# Patient Record
Sex: Female | Born: 1950 | Race: White | Hispanic: No | Marital: Married | Smoking: Former smoker
Health system: Southern US, Community
[De-identification: ages and names within clinical notes are randomized; demographics above are authoritative.]

## PROBLEM LIST (undated history)

## (undated) DIAGNOSIS — C801 Malignant (primary) neoplasm, unspecified: Secondary | ICD-10-CM

## (undated) DIAGNOSIS — K219 Gastro-esophageal reflux disease without esophagitis: Secondary | ICD-10-CM

## (undated) DIAGNOSIS — Z86010 Personal history of colonic polyps: Secondary | ICD-10-CM

## (undated) DIAGNOSIS — F32A Depression, unspecified: Secondary | ICD-10-CM

## (undated) DIAGNOSIS — F419 Anxiety disorder, unspecified: Secondary | ICD-10-CM

## (undated) DIAGNOSIS — E785 Hyperlipidemia, unspecified: Secondary | ICD-10-CM

## (undated) DIAGNOSIS — F329 Major depressive disorder, single episode, unspecified: Secondary | ICD-10-CM

## (undated) DIAGNOSIS — E079 Disorder of thyroid, unspecified: Secondary | ICD-10-CM

## (undated) DIAGNOSIS — G4730 Sleep apnea, unspecified: Secondary | ICD-10-CM

## (undated) DIAGNOSIS — T7840XA Allergy, unspecified, initial encounter: Secondary | ICD-10-CM

## (undated) DIAGNOSIS — I10 Essential (primary) hypertension: Secondary | ICD-10-CM

## (undated) HISTORY — DX: Hyperlipidemia, unspecified: E78.5

## (undated) HISTORY — DX: Major depressive disorder, single episode, unspecified: F32.9

## (undated) HISTORY — DX: Depression, unspecified: F32.A

## (undated) HISTORY — DX: Disorder of thyroid, unspecified: E07.9

## (undated) HISTORY — DX: Malignant (primary) neoplasm, unspecified: C80.1

## (undated) HISTORY — DX: Personal history of colonic polyps: Z86.010

## (undated) HISTORY — DX: Anxiety disorder, unspecified: F41.9

## (undated) HISTORY — PX: COLONOSCOPY: SHX174

## (undated) HISTORY — DX: Allergy, unspecified, initial encounter: T78.40XA

## (undated) HISTORY — PX: TONSILLECTOMY: SUR1361

## (undated) HISTORY — DX: Sleep apnea, unspecified: G47.30

## (undated) HISTORY — DX: Essential (primary) hypertension: I10

## (undated) HISTORY — PX: POLYPECTOMY: SHX149

## (undated) HISTORY — DX: Gastro-esophageal reflux disease without esophagitis: K21.9

## (undated) HISTORY — PX: BLADDER SUSPENSION: SHX72

---

## 1991-01-20 HISTORY — PX: BREAST LUMPECTOMY: SHX2

## 1991-01-20 HISTORY — PX: BREAST EXCISIONAL BIOPSY: SUR124

## 1991-01-20 HISTORY — PX: VAGINAL HYSTERECTOMY: SUR661

## 1997-06-22 ENCOUNTER — Ambulatory Visit (HOSPITAL_COMMUNITY): Admission: RE | Admit: 1997-06-22 | Discharge: 1997-06-22 | Payer: Self-pay | Admitting: Surgery

## 1998-09-27 ENCOUNTER — Ambulatory Visit (HOSPITAL_COMMUNITY): Admission: RE | Admit: 1998-09-27 | Discharge: 1998-09-27 | Payer: Self-pay | Admitting: Surgery

## 1998-09-27 ENCOUNTER — Encounter: Payer: Self-pay | Admitting: Surgery

## 1999-05-23 ENCOUNTER — Other Ambulatory Visit: Admission: RE | Admit: 1999-05-23 | Discharge: 1999-05-23 | Payer: Self-pay | Admitting: Obstetrics & Gynecology

## 2000-05-20 ENCOUNTER — Other Ambulatory Visit: Admission: RE | Admit: 2000-05-20 | Discharge: 2000-05-20 | Payer: Self-pay | Admitting: Obstetrics & Gynecology

## 2001-06-27 ENCOUNTER — Other Ambulatory Visit: Admission: RE | Admit: 2001-06-27 | Discharge: 2001-06-27 | Payer: Self-pay | Admitting: Obstetrics & Gynecology

## 2002-08-02 ENCOUNTER — Other Ambulatory Visit: Admission: RE | Admit: 2002-08-02 | Discharge: 2002-08-02 | Payer: Self-pay | Admitting: Obstetrics & Gynecology

## 2003-08-20 ENCOUNTER — Encounter: Admission: RE | Admit: 2003-08-20 | Discharge: 2003-08-20 | Payer: Self-pay | Admitting: Obstetrics & Gynecology

## 2003-08-20 ENCOUNTER — Other Ambulatory Visit: Admission: RE | Admit: 2003-08-20 | Discharge: 2003-08-20 | Payer: Self-pay | Admitting: Obstetrics & Gynecology

## 2004-02-22 ENCOUNTER — Encounter: Admission: RE | Admit: 2004-02-22 | Discharge: 2004-02-22 | Payer: Self-pay | Admitting: Obstetrics & Gynecology

## 2004-10-10 ENCOUNTER — Encounter: Admission: RE | Admit: 2004-10-10 | Discharge: 2004-10-10 | Payer: Self-pay | Admitting: Obstetrics & Gynecology

## 2004-10-13 ENCOUNTER — Other Ambulatory Visit: Admission: RE | Admit: 2004-10-13 | Discharge: 2004-10-13 | Payer: Self-pay | Admitting: Obstetrics & Gynecology

## 2005-10-19 ENCOUNTER — Encounter: Admission: RE | Admit: 2005-10-19 | Discharge: 2005-10-19 | Payer: Self-pay | Admitting: Obstetrics & Gynecology

## 2005-10-23 ENCOUNTER — Encounter: Admission: RE | Admit: 2005-10-23 | Discharge: 2005-10-23 | Payer: Self-pay | Admitting: Obstetrics & Gynecology

## 2006-04-07 ENCOUNTER — Encounter: Admission: RE | Admit: 2006-04-07 | Discharge: 2006-04-07 | Payer: Self-pay | Admitting: Obstetrics & Gynecology

## 2006-11-04 ENCOUNTER — Encounter: Admission: RE | Admit: 2006-11-04 | Discharge: 2006-11-04 | Payer: Self-pay | Admitting: Obstetrics & Gynecology

## 2006-11-10 ENCOUNTER — Encounter: Admission: RE | Admit: 2006-11-10 | Discharge: 2006-11-10 | Payer: Self-pay | Admitting: Obstetrics & Gynecology

## 2007-12-02 ENCOUNTER — Encounter: Admission: RE | Admit: 2007-12-02 | Discharge: 2007-12-02 | Payer: Self-pay | Admitting: Obstetrics & Gynecology

## 2008-12-18 ENCOUNTER — Encounter: Admission: RE | Admit: 2008-12-18 | Discharge: 2008-12-18 | Payer: Self-pay | Admitting: Obstetrics & Gynecology

## 2009-07-16 ENCOUNTER — Ambulatory Visit (HOSPITAL_BASED_OUTPATIENT_CLINIC_OR_DEPARTMENT_OTHER): Admission: RE | Admit: 2009-07-16 | Discharge: 2009-07-16 | Payer: Self-pay | Admitting: Urology

## 2010-02-08 ENCOUNTER — Encounter: Payer: Self-pay | Admitting: Obstetrics & Gynecology

## 2010-02-09 ENCOUNTER — Encounter: Payer: Self-pay | Admitting: Obstetrics & Gynecology

## 2010-02-11 ENCOUNTER — Encounter
Admission: RE | Admit: 2010-02-11 | Discharge: 2010-02-11 | Payer: Self-pay | Source: Home / Self Care | Attending: Obstetrics & Gynecology | Admitting: Obstetrics & Gynecology

## 2010-02-11 IMAGING — MG MM DIGITAL SCREENING
4 series · 4 of 4 positions shown · non-contrast
Comparison: none

DG SCREEN MAMMOGRAM BILATERAL
Bilateral CC and MLO view(s) were taken.
Technologist: CHOUAA

DIGITAL SCREENING MAMMOGRAM WITH CAD:
There are scattered fibroglandular densities.  No masses or malignant type calcifications are 
identified.  Compared with prior studies.
Images were processed with CAD.

[R CC]
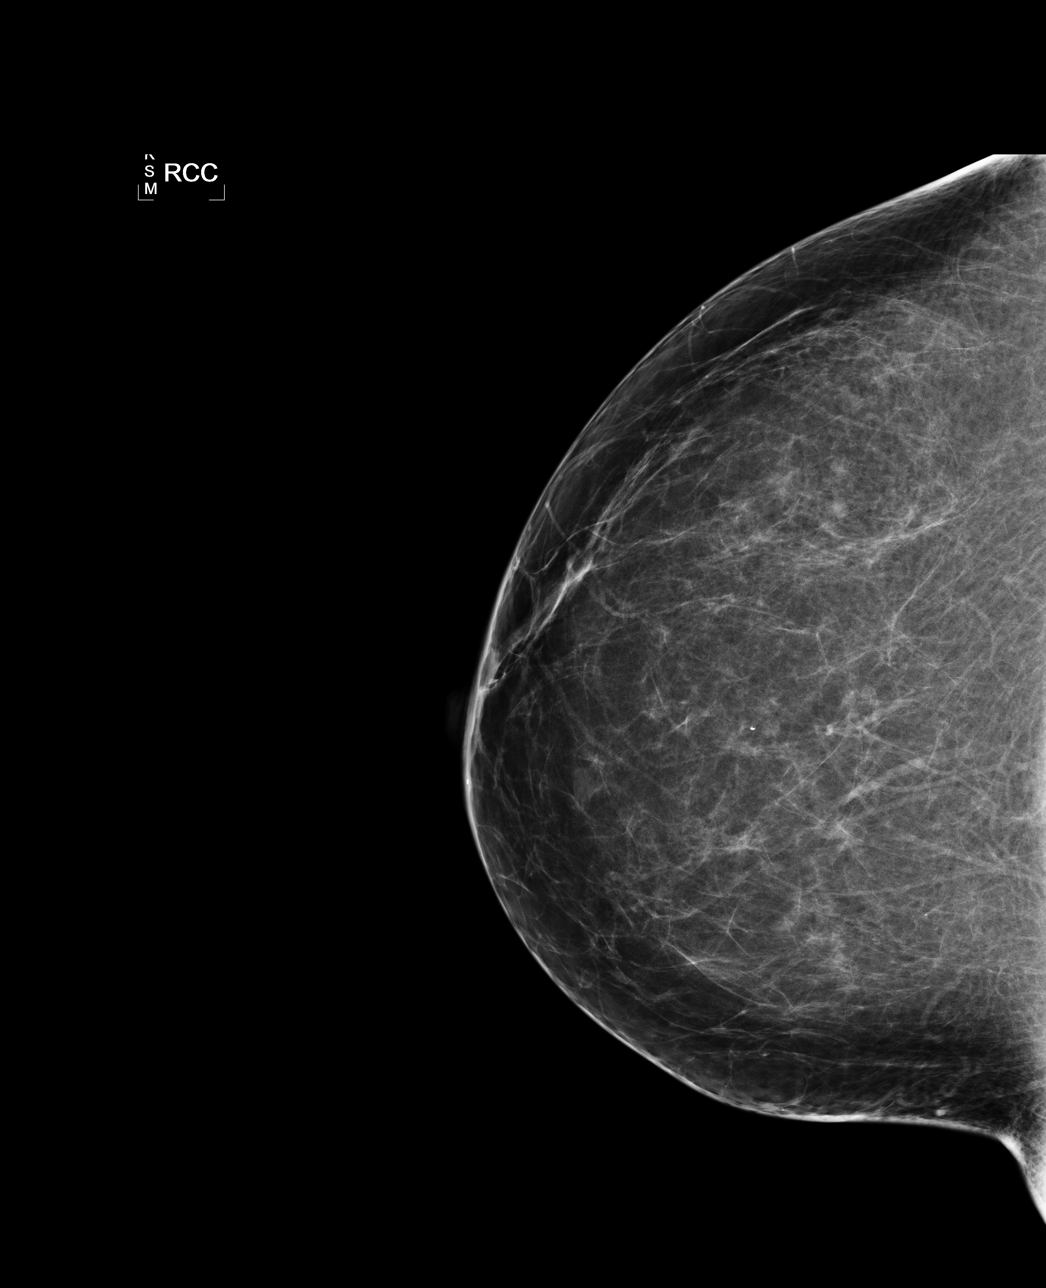

[L CC]
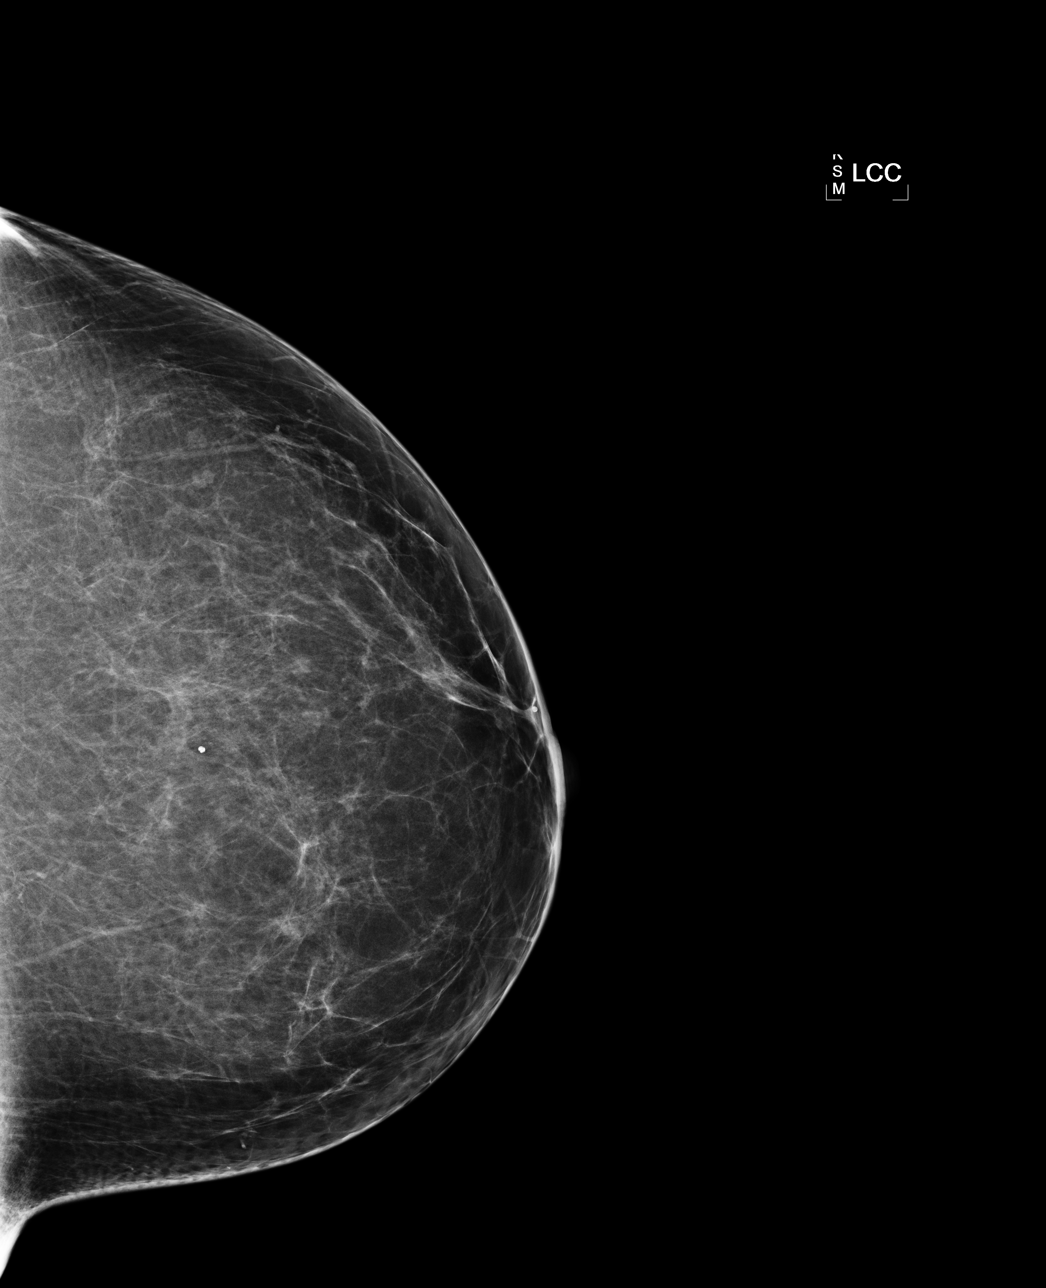

[L MLO]
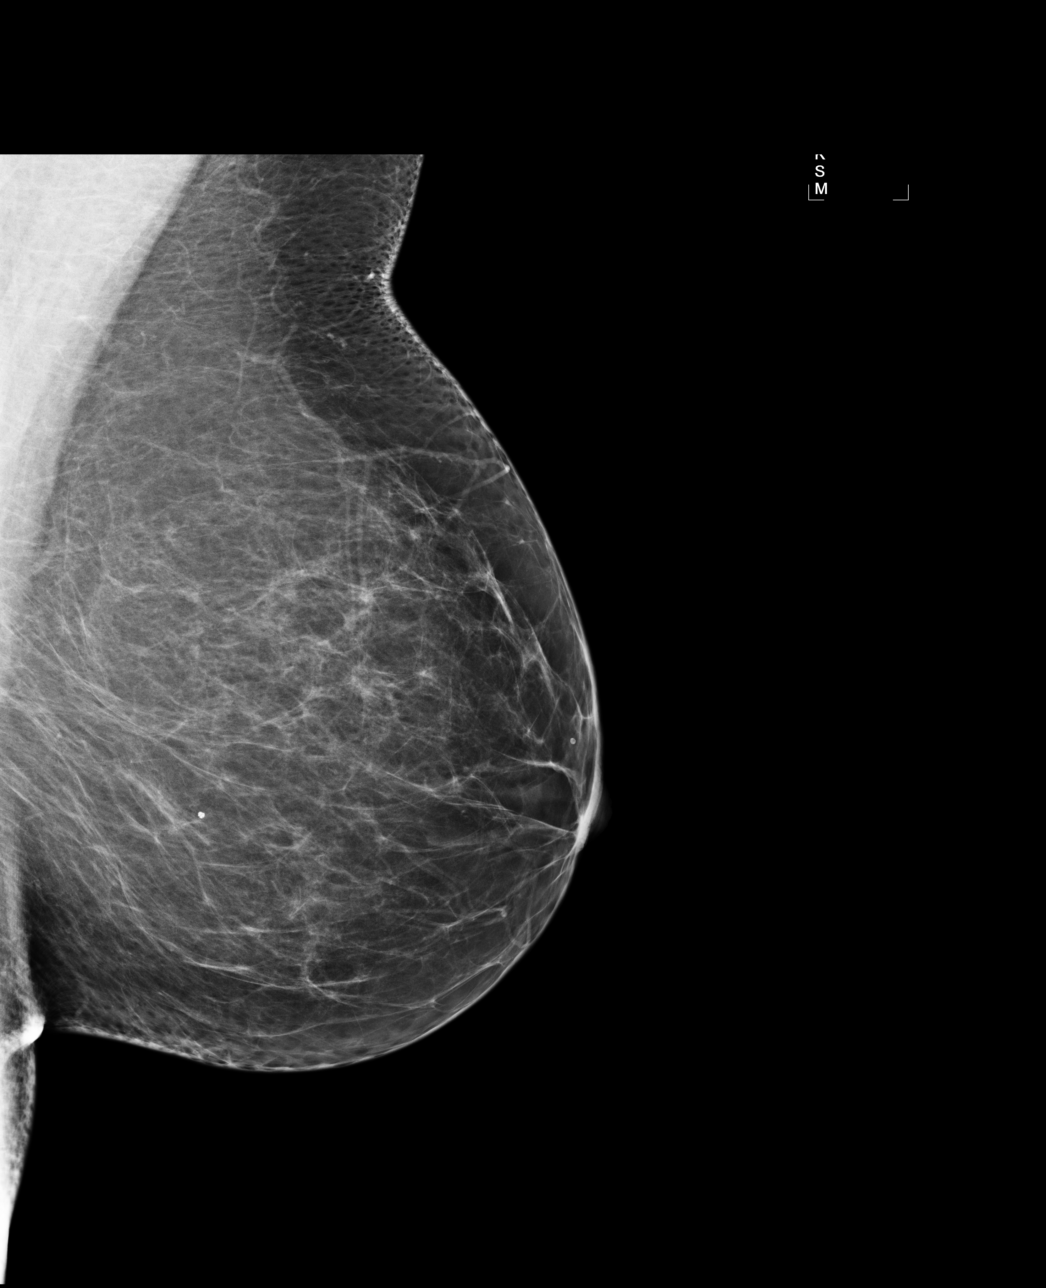

[R MLO]
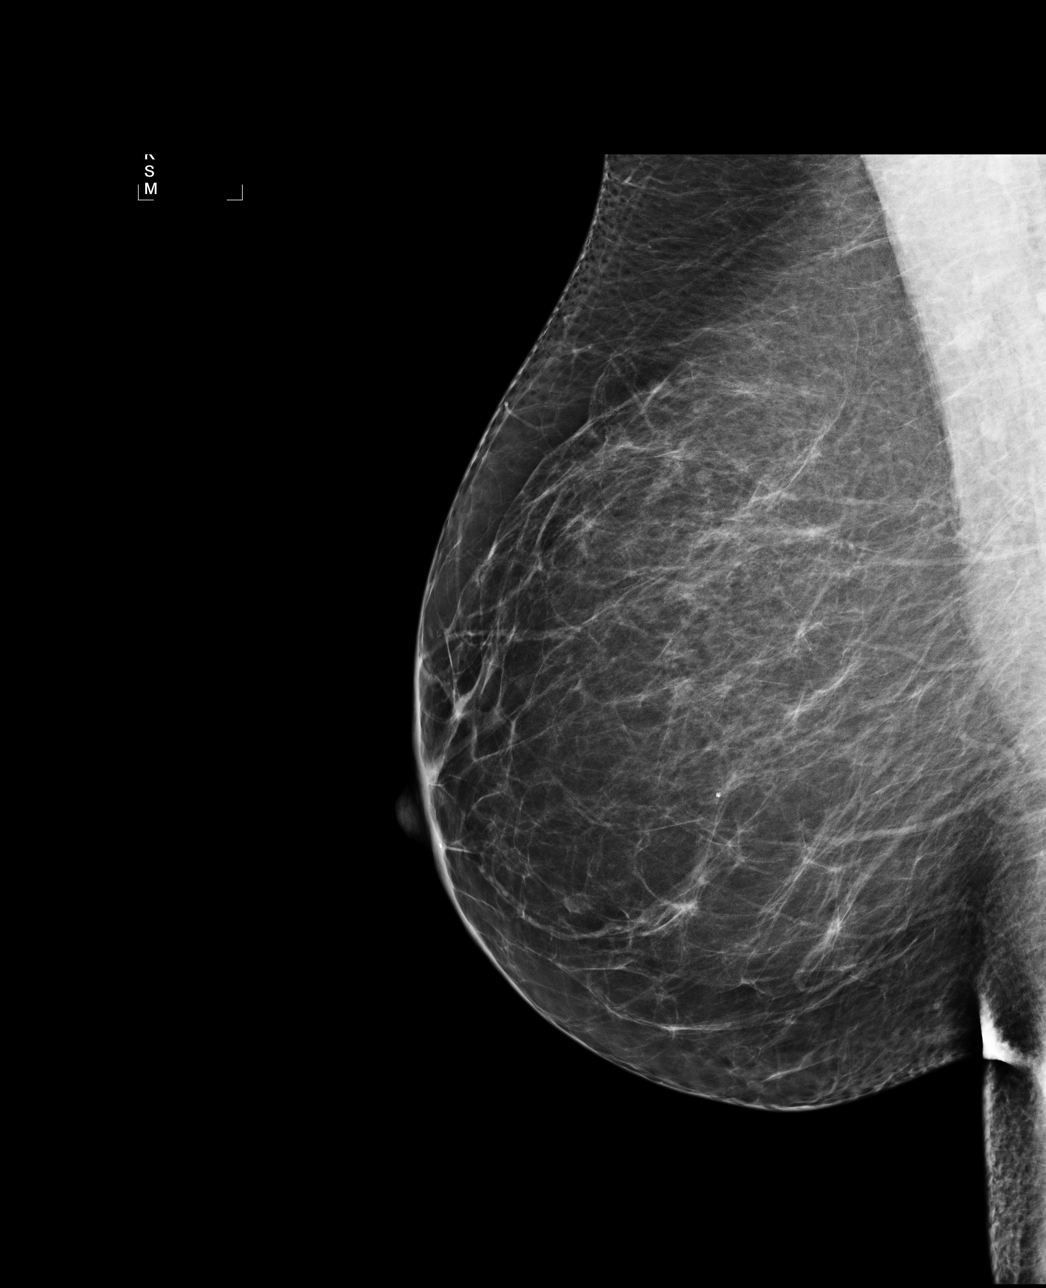

[4 of 4 positions shown; findings below may reference images not displayed]

IMPRESSION: No specific mammographic evidence of malignancy.  Next screening mammogram is recommended in one 
year.

A result letter of this screening mammogram will be mailed directly to the patient.

ASSESSMENT: Negative - BI-RADS 1

Screening mammogram in 1 year.
,

## 2010-02-12 ENCOUNTER — Encounter: Payer: Self-pay | Admitting: Internal Medicine

## 2010-02-20 NOTE — Letter (Signed)
Summary: Pre Visit Letter Revised  [facility]  [address] [place], [place] [address]   Phone: [phone]  Fax: [phone]        02/12/2010 MRN: [id number]  [facility] [address] [place], [place]  [address]             Procedure Date: 03-20-10  8am           Direct Colon -Dr Abundio Miu to the Gastroenterology Division at [facility].    You are scheduled to see a nurse for your pre-procedure visit on 03-06-10 at 3:30pm on the 3rd floor at [facility], [address]  We ask that you try to arrive at our office 15 minutes prior to your appointment time to allow for check-in.  Please take a minute to review the attached form.  If you answer "Yes" to one or more of the questions on the first page, we ask that you call the person listed at your earliest opportunity.  If you answer "No" to all of the questions, please complete the rest of the form and bring it to your appointment.    Your nurse visit will consist of discussing your medical and surgical history, your immediate family medical history, and your medications.   If you are unable to list all of your medications on the form, please bring the medication bottles to your appointment and we will list them.  We will need to be aware of both prescribed and over the counter drugs.  We will need to know exact dosage information as well.    Please be prepared to read and sign documents such as consent forms, a financial agreement, and acknowledgement forms.  If necessary, and with your consent, a friend or relative is welcome to sit-in on the nurse visit with you.  Please bring your insurance card so that we may make a copy of it.  If your insurance requires a referral to see a specialist, please bring your referral form from your primary care physician.  No co-pay is required for this nurse visit.     If you cannot keep your appointment, please call [phone] to cancel or reschedule  prior to your appointment date.  This allows [place] the opportunity to schedule an appointment for another patient in need of care.    Thank you for choosing [facility] for your medical needs.  We appreciate the opportunity to care for you.  Please visit [place] at our website  to learn more about our practice.  Sincerely, The Gastroenterology Division

## 2010-03-04 ENCOUNTER — Encounter (INDEPENDENT_AMBULATORY_CARE_PROVIDER_SITE_OTHER): Payer: Self-pay | Admitting: *Deleted

## 2010-03-06 ENCOUNTER — Encounter: Payer: Self-pay | Admitting: Internal Medicine

## 2010-03-12 NOTE — Miscellaneous (Signed)
Summary: [facility]  Clinical Lists Changes  Medications: Added new medication of MOVIPREP 100 GM  SOLR (PEG-KCL-NACL-NASULF-NA ASC-C) As per prep instructions. - Signed Rx of MOVIPREP 100 GM  SOLR (PEG-KCL-NACL-NASULF-NA ASC-C) As per prep instructions.;  #1 x 0;  Signed;  Entered by: Ezra Sites RN;  Authorized by: Iva Boop MD, FACG;  Method used: Electronically to [facility]  [address] [id number] [address] [place], [place]  [address], Ph: [phone] or [phone], Fax: [phone] Allergies: Added new allergy or adverse reaction of PCN Observations: Added new observation of NKA: F (03/06/2010 15:06)    Prescriptions: MOVIPREP 100 GM  SOLR (PEG-KCL-NACL-NASULF-NA ASC-C) As per prep instructions.  #1 x 0   Entered by:   Ezra Sites RN   Authorized by:   Iva Boop MD, [facility]   Signed by:   Ezra Sites RN on 03/06/2010   Method used:   [facility]ronically to        [facility]  [address] [id number] ([facility]ail)       [address]       [place], [place]  [address]       Ph: [phone] or [phone]       Fax: [phone]   RxID:   [id number]

## 2010-03-12 NOTE — Letter (Signed)
Summary: [facility] Instructions  [facility]  [address] [place], [place] [address]   Phone: [phone]  Fax: [phone]       Susan Thornton    08/07/1950    MRN: [id number]        Procedure Day /Date:  Monday 04/14/2010     Arrival Time:  12:30 pm     Procedure Time:  1:30 pm     Location of Procedure:                    _X _  [facility] (4th Floor)                        PREPARATION FOR COLONOSCOPY WITH MOVIPREP   Starting 5 days prior to your procedure  Wednesday 3/21 do not eat nuts, seeds, popcorn, corn, beans, peas,  salads, or any raw vegetables.  Do not take any fiber supplements (e.g. Metamucil, Citrucel, and Benefiber).  THE DAY BEFORE YOUR PROCEDURE         DATE:  Sunday 3/25  1.  Drink clear liquids the entire day-NO SOLID FOOD  2.  Do not drink anything colored red or purple.  Avoid juices with pulp.  No orange juice.  3.  Drink at least 64 oz. (8 glasses) of fluid/clear liquids during the day to prevent dehydration and help the prep work efficiently.  CLEAR LIQUIDS INCLUDE: Water Jello Ice Popsicles Tea (sugar ok, no milk/cream) Powdered fruit flavored drinks Coffee (sugar ok, no milk/cream) Gatorade Juice: apple, white grape, white cranberry  Lemonade Clear bullion, consomm, broth Carbonated beverages (any kind) Strained chicken noodle soup Hard Candy                             4.  In the morning, mix first dose of MoviPrep solution:    Empty 1 Pouch A and 1 Pouch B into the disposable container    Add lukewarm drinking water to the top line of the container. Mix to dissolve    Refrigerate (mixed solution should be used within 24 hrs)  5.  Begin drinking the prep at 5:00 p.m. The MoviPrep container is divided by 4 marks.   Every 15 minutes drink the solution down to the next mark (approximately 8 oz) until the full liter is complete.   6.  Follow completed prep with 16 oz of clear liquid of your choice  (Nothing red or purple).  Continue to drink clear liquids until bedtime.  7.  Before going to bed, mix second dose of MoviPrep solution:    Empty 1 Pouch A and 1 Pouch B into the disposable container    Add lukewarm drinking water to the top line of the container. Mix to dissolve    Refrigerate  THE DAY OF YOUR PROCEDURE      DATE: Monday 3/26  Beginning at  8:30 a.m. (5 hours before procedure):         1. Every 15 minutes, drink the solution down to the next mark (approx 8 oz) until the full liter is complete.  2. Follow completed prep with 16 oz. of clear liquid of your choice.    3. You may drink clear liquids until 6:00am  (2 HOURS BEFORE PROCEDURE).   MEDICATION INSTRUCTIONS  Unless otherwise instructed, you should take regular prescription medications with a small sip of water   as early as  possible the morning of your procedure.         OTHER INSTRUCTIONS  You will need a responsible adult at least 59 years of age to accompany you and drive you home.   This person must remain in the waiting room during your procedure.  Wear loose fitting clothing that is easily removed.  Leave jewelry and other valuables at home.  However, you may wish to bring a book to read or  an iPod/MP3 player to listen to music as you wait for your procedure to start.  Remove all body piercing jewelry and leave at home.  Total time from sign-in until discharge is approximately 2-3 hours.  You should go home directly after your procedure and rest.  You can resume normal activities the  day after your procedure.  The day of your procedure you should not:   Drive   Make legal decisions   Operate machinery   Drink alcohol   Return to work  You will receive specific instructions about eating, activities and medications before you leave.    The above instructions have been reviewed and explained to me by   Ezra Sites RN  March 06, 2010 3:41 PM    I fully understand and  can verbalize these instructions _____________________________ Date _________

## 2010-03-20 ENCOUNTER — Other Ambulatory Visit: Payer: Self-pay | Admitting: Internal Medicine

## 2010-04-06 LAB — CBC
HCT: 41.4 % (ref 36.0–46.0)
Hemoglobin: 14.2 g/dL (ref 12.0–15.0)
MCH: 31.1 pg (ref 26.0–34.0)
RBC: 4.57 MIL/uL (ref 3.87–5.11)
WBC: 7.7 10*3/uL (ref 4.0–10.5)

## 2010-04-06 LAB — TYPE AND SCREEN: Antibody Screen: NEGATIVE

## 2010-04-11 ENCOUNTER — Encounter: Payer: Self-pay | Admitting: Internal Medicine

## 2010-04-14 ENCOUNTER — Encounter: Payer: Self-pay | Admitting: Internal Medicine

## 2010-04-14 ENCOUNTER — Ambulatory Visit (AMBULATORY_SURGERY_CENTER): Payer: Managed Care, Other (non HMO) | Admitting: Internal Medicine

## 2010-04-14 VITALS — BP 128/71 | HR 75 | Temp 98.2°F | Resp 20 | Ht 65.0 in | Wt 201.0 lb

## 2010-04-14 DIAGNOSIS — D126 Benign neoplasm of colon, unspecified: Secondary | ICD-10-CM

## 2010-04-14 DIAGNOSIS — K621 Rectal polyp: Secondary | ICD-10-CM

## 2010-04-14 DIAGNOSIS — Z1211 Encounter for screening for malignant neoplasm of colon: Secondary | ICD-10-CM

## 2010-04-14 DIAGNOSIS — D128 Benign neoplasm of rectum: Secondary | ICD-10-CM

## 2010-04-14 DIAGNOSIS — K620 Anal polyp: Secondary | ICD-10-CM

## 2010-04-14 NOTE — Progress Notes (Signed)
PER MD- 12 POLYPS, HEMORRHOIDS

## 2010-04-14 NOTE — Patient Instructions (Signed)
Review discharge instruction sheets; drink plenty of fluids today; call [phone] if you have any questions regarding your symptoms.  Any pathology will be addressed within two weeks with a letter to your home.

## 2010-04-15 ENCOUNTER — Telehealth: Payer: Self-pay | Admitting: *Deleted

## 2010-04-15 DIAGNOSIS — Z860100 Personal history of colon polyps, unspecified: Secondary | ICD-10-CM

## 2010-04-15 HISTORY — DX: Personal history of colonic polyps: Z86.010

## 2010-04-15 HISTORY — DX: Personal history of colon polyps, unspecified: Z86.0100

## 2010-04-15 NOTE — Telephone Encounter (Signed)

## 2010-04-18 ENCOUNTER — Encounter: Payer: Self-pay | Admitting: Internal Medicine

## 2010-04-18 NOTE — Progress Notes (Signed)
Quick Note:  12 polyps, 2 were adenomas, others hyperplastic 3 year colon recall 03/2013 ______

## 2010-04-22 NOTE — Procedures (Signed)
Summary: Colonoscopy  Patient: [name] Note: All result statuses are Final unless otherwise noted.  Tests: (1) Colonoscopy (COL)   COL Colonoscopy           DONE     [facility]     [address]     [place], [place]  [address]          COLONOSCOPY PROCEDURE REPORT          PATIENT:  Susan Thornton, Susan Thornton  MR#:  [id number]     BIRTHDATE:  [birth year], 59 yrs. old  GENDER:  female     ENDOSCOPIST:  Iva Boop, MD, [facility]     REF. BY:  Varney Baas, M.D.     PROCEDURE DATE:  04/14/2010     PROCEDURE:  Colonoscopy with snare polypectomy     ASA CLASS:  Class I     INDICATIONS:  Routine Risk Screening     MEDICATIONS:   Fentanyl 75 mcg IV, Versed 9 mg IV          DESCRIPTION OF PROCEDURE:   After the risks benefits and     alternatives of the procedure were thoroughly explained, informed     consent was obtained.  Digital rectal exam was performed and     revealed no abnormalities.   The LB CF-H180AL [id number] endoscope     was introduced through the anus and advanced to the cecum, which     was identified by both the appendix and ileocecal valve, without     limitations.  The quality of the prep was excellent, using     MoviPrep.  The instrument was then slowly withdrawn as the colon     was fully examined. Insertion: 6:41 minutes Withdrawal: 17:21     minutes     <<PROCEDUREIMAGES>>          FINDINGS:  There were multiple polyps identified and removed.     Twelve (12) total: Ascending -5mm - (1), descending (1), sigmoid     (6) and rectum (4). All removed with cold snare and sent to     pathology. Largest 5-6 mm, smallest 2mm.  This was otherwise a     normal examination of the colon.   Retroflexed views in the rectum     revealed internal and external hemorrhoids.    The scope was then     withdrawn from the patient and the procedure completed.          COMPLICATIONS:  None     ENDOSCOPIC IMPRESSION:     1) Polyps, multiple removed. 12 small polyps removed.     2) Internal and external hemorrhoids - small     3) Otherwise normal examination          REPEAT EXAM:  In for Colonoscopy, pending biopsy results.          Iva Boop, MD, [facility]          CC:  Varney Baas, MD and The Patient          n.     eSIGNED:   Iva Boop at 04/14/2010 02:16 PM          Tear[place], Gibbs Lewis, [id number]  Note: An exclamation mark (!) indicates a result that was not dispersed into the flowsheet. Document Creation Date: 04/14/2010 2:17 PM _______________________________________________________________________  (1) Order result status: Final Collection or observation date-time: 04/14/2010 14:09 Requested date-time:  Receipt date-time:  Reported date-time:  Referring Physician:  Ordering Physician: Stan Head [phone]) Specimen Source:  Source: Launa Grill Order Number: [id number] Lab site:

## 2011-03-12 ENCOUNTER — Other Ambulatory Visit: Payer: Self-pay | Admitting: Obstetrics & Gynecology

## 2011-03-12 DIAGNOSIS — N6310 Unspecified lump in the right breast, unspecified quadrant: Secondary | ICD-10-CM

## 2011-03-20 ENCOUNTER — Ambulatory Visit
Admission: RE | Admit: 2011-03-20 | Discharge: 2011-03-20 | Disposition: A | Discharge status: Home / Self Care | Payer: Managed Care, Other (non HMO) | Source: Ambulatory Visit | Attending: Obstetrics & Gynecology | Admitting: Obstetrics & Gynecology

## 2011-03-20 DIAGNOSIS — N6310 Unspecified lump in the right breast, unspecified quadrant: Secondary | ICD-10-CM

## 2012-01-25 DIAGNOSIS — F419 Anxiety disorder, unspecified: Secondary | ICD-10-CM | POA: Insufficient documentation

## 2012-04-07 ENCOUNTER — Other Ambulatory Visit: Payer: Self-pay | Admitting: Obstetrics & Gynecology

## 2012-04-07 DIAGNOSIS — R928 Other abnormal and inconclusive findings on diagnostic imaging of breast: Secondary | ICD-10-CM

## 2012-04-15 ENCOUNTER — Ambulatory Visit
Admission: RE | Admit: 2012-04-15 | Discharge: 2012-04-15 | Disposition: A | Discharge status: Home / Self Care | Payer: Self-pay | Source: Ambulatory Visit | Attending: Obstetrics & Gynecology | Admitting: Obstetrics & Gynecology

## 2012-04-15 DIAGNOSIS — R928 Other abnormal and inconclusive findings on diagnostic imaging of breast: Secondary | ICD-10-CM

## 2013-03-22 ENCOUNTER — Encounter: Payer: Self-pay | Admitting: Internal Medicine

## 2013-10-14 ENCOUNTER — Encounter: Payer: Self-pay | Admitting: Internal Medicine

## 2014-02-05 ENCOUNTER — Encounter: Payer: Self-pay | Admitting: Internal Medicine

## 2014-04-16 ENCOUNTER — Ambulatory Visit (AMBULATORY_SURGERY_CENTER): Payer: Self-pay | Admitting: *Deleted

## 2014-04-16 ENCOUNTER — Other Ambulatory Visit: Payer: Self-pay | Admitting: Obstetrics & Gynecology

## 2014-04-16 VITALS — Ht 64.5 in | Wt 220.0 lb

## 2014-04-16 DIAGNOSIS — Z86010 Personal history of colonic polyps: Secondary | ICD-10-CM

## 2014-04-16 NOTE — Progress Notes (Signed)
Patient denies any allergies to eggs or soy. Patient denies any problems with anesthesia/sedation. Patient denies any oxygen use at home and does not take any diet/weight loss medications. EMMI education assisgned to patient on colonoscopy, this was explained and instructions given to patient. 

## 2014-04-17 LAB — CYTOLOGY - PAP

## 2014-04-30 ENCOUNTER — Encounter: Payer: Self-pay | Admitting: Internal Medicine

## 2014-04-30 ENCOUNTER — Ambulatory Visit (AMBULATORY_SURGERY_CENTER): Payer: 59 | Admitting: Internal Medicine

## 2014-04-30 VITALS — BP 134/71 | HR 56 | Temp 97.7°F | Resp 25 | Ht 64.5 in | Wt 220.0 lb

## 2014-04-30 DIAGNOSIS — D125 Benign neoplasm of sigmoid colon: Secondary | ICD-10-CM | POA: Diagnosis not present

## 2014-04-30 DIAGNOSIS — Z86010 Personal history of colonic polyps: Secondary | ICD-10-CM | POA: Diagnosis present

## 2014-04-30 MED ORDER — SODIUM CHLORIDE 0.9 % IV SOLN: 500.0000 mL | INTRAVENOUS | Status: DC

## 2014-04-30 NOTE — Patient Instructions (Signed)
Discharge instructions given. Handout on polyps. Resume previous medications. YOU HAD AN ENDOSCOPIC PROCEDURE TODAY AT THE [facility]:   Refer to the procedure report that was given to you for any specific questions about what was found during the examination.  If the procedure report does not answer your questions, please call your gastroenterologist to clarify.  If you requested that your care partner not be given the details of your procedure findings, then the procedure report has been included in a sealed envelope for you to review at your convenience later.  YOU SHOULD EXPECT: Some feelings of bloating in the abdomen. Passage of more gas than usual.  Walking can help get rid of the air that was put into your GI tract during the procedure and reduce the bloating. If you had a lower endoscopy (such as a colonoscopy or flexible sigmoidoscopy) you may notice spotting of blood in your stool or on the toilet paper. If you underwent a bowel prep for your procedure, you may not have a normal bowel movement for a few days.  Please Note:  You might notice some irritation and congestion in your nose or some drainage.  This is from the oxygen used during your procedure.  There is no need for concern and it should clear up in a day or so.  SYMPTOMS TO REPORT IMMEDIATELY:   Following lower endoscopy (colonoscopy or flexible sigmoidoscopy):  Excessive amounts of blood in the stool  Significant tenderness or worsening of abdominal pains  Swelling of the abdomen that is new, acute  Fever of 100F or higher   For urgent or emergent issues, a gastroenterologist can be reached at any hour by calling [phone].   DIET: Your first meal following the procedure should be a small meal and then it is ok to progress to your normal diet. Heavy or fried foods are harder to digest and may make you feel nauseous or bloated.  Likewise, meals heavy in dairy and vegetables can increase bloating.  Drink  plenty of fluids but you should avoid alcoholic beverages for 24 hours.  ACTIVITY:  You should plan to take it easy for the rest of today and you should NOT DRIVE or use heavy machinery until tomorrow (because of the sedation medicines used during the test).    FOLLOW UP: Our staff will call the number listed on your records the next business day following your procedure to check on you and address any questions or concerns that you may have regarding the information given to you following your procedure. If we do not reach you, we will leave a message.  However, if you are feeling well and you are not experiencing any problems, there is no need to return our call.  We will assume that you have returned to your regular daily activities without incident.  If any biopsies were taken you will be contacted by phone or by letter within the next 1-3 weeks.  Please call us at [phone] if you have not heard about the biopsies in 3 weeks.    SIGNATURES/CONFIDENTIALITY: You and/or your care partner have signed paperwork which will be entered into your electronic medical record.  These signatures attest to the fact that that the information above on your After Visit Summary has been reviewed and is understood.  Full responsibility of the confidentiality of this discharge information lies with you and/or your care-partner. 

## 2014-04-30 NOTE — Progress Notes (Signed)
Pt awake and alert, vss, pleased with MAC. Report to RN

## 2014-04-30 NOTE — Progress Notes (Signed)
Called to room to assist during endoscopic procedure.  Patient ID and intended procedure confirmed with present staff. Received instructions for my participation in the procedure from the performing physician.  

## 2014-04-30 NOTE — Op Note (Addendum)
[place]  [address] [place], [address]   COLONOSCOPY PROCEDURE REPORT  PATIENT: Susan, Thornton  MR#: [id number] BIRTHDATE: [birth year] , 63  yrs. old GENDER: female ENDOSCOPIST: Gatha Mayer, MD, [facility] PROCEDURE DATE:  04/30/2014 PROCEDURE:   Colonoscopy, surveillance and Colonoscopy with snare polypectomy First Screening Colonoscopy - Avg.  risk and is 50 yrs.  old or older - No.  Prior Negative Screening - Now for repeat screening. N/A  History of Adenoma - Now for follow-up colonoscopy & has been > or = to 3 yrs.  Yes hx of adenoma.  Has been 3 or more years since last colonoscopy. ASA CLASS:   Class II INDICATIONS:Surveillance due to prior colonic neoplasia and PH Colon Adenoma. MEDICATIONS: Propofol 350 mg IV and Monitored anesthesia care  DESCRIPTION OF PROCEDURE:   After the risks benefits and alternatives of the procedure were thoroughly explained, informed consent was obtained.  The digital rectal exam revealed no abnormalities of the rectum.   The LB CF-H180AL Loaner [id number] endoscope was introduced through the anus and advanced to the cecum, which was identified by both the appendix and ileocecal valve. No adverse events experienced.   The quality of the prep was (MiraLax was used) excellent.  The instrument was then slowly withdrawn as the colon was fully examined.      COLON FINDINGS: Two sessile polyps measuring 5 mm in size were found in the sigmoid colon.  Polypectomies were performed with a cold snare.  The resection was complete, the polyp tissue was completely retrieved and sent to histology.   The examination was otherwise normal.  Retroflexed views revealed no abnormalities. The time to cecum = 2.8 Withdrawal time = 13.3   The scope was withdrawn and the procedure completed. COMPLICATIONS: There were no immediate complications.  ENDOSCOPIC IMPRESSION: 1.   Two sessile polyps were found in the sigmoid colon; polypectomies  were performed with a cold snare 2.   The examination was otherwise normal - excellent prep - hx adenomas x 2 2013  RECOMMENDATIONS: Timing of repeat colonoscopy will be determined by pathology findings.  eSigned:  Gatha Mayer, MD, [facility] 04/30/2014 9:48 AM Revised: 04/30/2014 9:48 AM  cc: The Patient        Cyndi Bender, PA-C

## 2014-05-01 ENCOUNTER — Telehealth: Payer: Self-pay

## 2014-05-01 NOTE — Telephone Encounter (Signed)
  Follow up Call-  Call back number 04/30/2014  Post procedure Call Back phone  # [phone]  Permission to leave phone message Yes     Patient questions:  Do you have a fever, pain , or abdominal swelling? No. Pain Score  0 *  Have you tolerated food without any problems? Yes.    Have you been able to return to your normal activities? Yes.    Do you have any questions about your discharge instructions: Diet   No. Medications  No. Follow up visit  No.  Do you have questions or concerns about your Care? No.  Actions: * If pain score is 4 or above: No action needed, pain <4.

## 2014-05-03 ENCOUNTER — Encounter: Payer: Self-pay | Admitting: Internal Medicine

## 2014-05-03 DIAGNOSIS — Z86010 Personal history of colonic polyps: Secondary | ICD-10-CM

## 2014-05-03 NOTE — Progress Notes (Signed)
Quick Note:  1 adenoma and 1 hyperplastic - repeat colon 2021 ______

## 2016-08-11 DIAGNOSIS — F418 Other specified anxiety disorders: Secondary | ICD-10-CM | POA: Diagnosis not present

## 2016-08-11 DIAGNOSIS — Z9181 History of falling: Secondary | ICD-10-CM | POA: Diagnosis not present

## 2016-08-11 DIAGNOSIS — I10 Essential (primary) hypertension: Secondary | ICD-10-CM | POA: Diagnosis not present

## 2016-08-11 DIAGNOSIS — E7800 Pure hypercholesterolemia, unspecified: Secondary | ICD-10-CM | POA: Diagnosis not present

## 2016-08-11 DIAGNOSIS — Z6837 Body mass index (BMI) 37.0-37.9, adult: Secondary | ICD-10-CM | POA: Diagnosis not present

## 2016-08-11 DIAGNOSIS — G4733 Obstructive sleep apnea (adult) (pediatric): Secondary | ICD-10-CM | POA: Diagnosis not present

## 2016-08-11 DIAGNOSIS — G4762 Sleep related leg cramps: Secondary | ICD-10-CM | POA: Diagnosis not present

## 2016-08-11 DIAGNOSIS — R7303 Prediabetes: Secondary | ICD-10-CM | POA: Diagnosis not present

## 2016-08-11 DIAGNOSIS — K219 Gastro-esophageal reflux disease without esophagitis: Secondary | ICD-10-CM | POA: Diagnosis not present

## 2016-08-11 DIAGNOSIS — Z1389 Encounter for screening for other disorder: Secondary | ICD-10-CM | POA: Diagnosis not present

## 2016-08-11 DIAGNOSIS — Z79899 Other long term (current) drug therapy: Secondary | ICD-10-CM | POA: Diagnosis not present

## 2016-09-10 DIAGNOSIS — G4733 Obstructive sleep apnea (adult) (pediatric): Secondary | ICD-10-CM | POA: Diagnosis not present

## 2016-09-22 DIAGNOSIS — E612 Magnesium deficiency: Secondary | ICD-10-CM | POA: Diagnosis not present

## 2016-09-22 DIAGNOSIS — F419 Anxiety disorder, unspecified: Secondary | ICD-10-CM | POA: Diagnosis not present

## 2016-09-22 DIAGNOSIS — Z79899 Other long term (current) drug therapy: Secondary | ICD-10-CM | POA: Diagnosis not present

## 2016-09-22 DIAGNOSIS — G4733 Obstructive sleep apnea (adult) (pediatric): Secondary | ICD-10-CM | POA: Diagnosis not present

## 2016-09-22 DIAGNOSIS — Z9181 History of falling: Secondary | ICD-10-CM | POA: Diagnosis not present

## 2016-09-22 DIAGNOSIS — I10 Essential (primary) hypertension: Secondary | ICD-10-CM | POA: Diagnosis not present

## 2016-09-22 DIAGNOSIS — Z6838 Body mass index (BMI) 38.0-38.9, adult: Secondary | ICD-10-CM | POA: Diagnosis not present

## 2016-09-22 DIAGNOSIS — N289 Disorder of kidney and ureter, unspecified: Secondary | ICD-10-CM | POA: Diagnosis not present

## 2016-10-02 DIAGNOSIS — Z6838 Body mass index (BMI) 38.0-38.9, adult: Secondary | ICD-10-CM | POA: Diagnosis not present

## 2016-10-02 DIAGNOSIS — N390 Urinary tract infection, site not specified: Secondary | ICD-10-CM | POA: Diagnosis not present

## 2016-10-11 DIAGNOSIS — G4733 Obstructive sleep apnea (adult) (pediatric): Secondary | ICD-10-CM | POA: Diagnosis not present

## 2016-11-09 DIAGNOSIS — R87612 Low grade squamous intraepithelial lesion on cytologic smear of cervix (LGSIL): Secondary | ICD-10-CM | POA: Diagnosis not present

## 2016-11-10 DIAGNOSIS — G4733 Obstructive sleep apnea (adult) (pediatric): Secondary | ICD-10-CM | POA: Diagnosis not present

## 2016-12-11 DIAGNOSIS — G4733 Obstructive sleep apnea (adult) (pediatric): Secondary | ICD-10-CM | POA: Diagnosis not present

## 2016-12-14 DIAGNOSIS — Z0000 Encounter for general adult medical examination without abnormal findings: Secondary | ICD-10-CM | POA: Diagnosis not present

## 2016-12-14 DIAGNOSIS — Z136 Encounter for screening for cardiovascular disorders: Secondary | ICD-10-CM | POA: Diagnosis not present

## 2016-12-14 DIAGNOSIS — E785 Hyperlipidemia, unspecified: Secondary | ICD-10-CM | POA: Diagnosis not present

## 2016-12-14 DIAGNOSIS — E669 Obesity, unspecified: Secondary | ICD-10-CM | POA: Diagnosis not present

## 2016-12-14 DIAGNOSIS — Z6839 Body mass index (BMI) 39.0-39.9, adult: Secondary | ICD-10-CM | POA: Diagnosis not present

## 2017-01-10 DIAGNOSIS — G4733 Obstructive sleep apnea (adult) (pediatric): Secondary | ICD-10-CM | POA: Diagnosis not present

## 2017-01-19 DIAGNOSIS — G4733 Obstructive sleep apnea (adult) (pediatric): Secondary | ICD-10-CM | POA: Diagnosis not present

## 2017-02-10 DIAGNOSIS — G4733 Obstructive sleep apnea (adult) (pediatric): Secondary | ICD-10-CM | POA: Diagnosis not present

## 2017-03-13 DIAGNOSIS — G4733 Obstructive sleep apnea (adult) (pediatric): Secondary | ICD-10-CM | POA: Diagnosis not present

## 2017-03-23 DIAGNOSIS — G4733 Obstructive sleep apnea (adult) (pediatric): Secondary | ICD-10-CM | POA: Diagnosis not present

## 2017-03-25 DIAGNOSIS — K219 Gastro-esophageal reflux disease without esophagitis: Secondary | ICD-10-CM | POA: Diagnosis not present

## 2017-03-25 DIAGNOSIS — R7303 Prediabetes: Secondary | ICD-10-CM | POA: Diagnosis not present

## 2017-03-25 DIAGNOSIS — G4762 Sleep related leg cramps: Secondary | ICD-10-CM | POA: Diagnosis not present

## 2017-03-25 DIAGNOSIS — G4700 Insomnia, unspecified: Secondary | ICD-10-CM | POA: Diagnosis not present

## 2017-03-25 DIAGNOSIS — R635 Abnormal weight gain: Secondary | ICD-10-CM | POA: Diagnosis not present

## 2017-03-25 DIAGNOSIS — E7800 Pure hypercholesterolemia, unspecified: Secondary | ICD-10-CM | POA: Diagnosis not present

## 2017-03-25 DIAGNOSIS — J019 Acute sinusitis, unspecified: Secondary | ICD-10-CM | POA: Diagnosis not present

## 2017-03-25 DIAGNOSIS — G4733 Obstructive sleep apnea (adult) (pediatric): Secondary | ICD-10-CM | POA: Diagnosis not present

## 2017-03-25 DIAGNOSIS — I10 Essential (primary) hypertension: Secondary | ICD-10-CM | POA: Diagnosis not present

## 2017-04-10 DIAGNOSIS — G4733 Obstructive sleep apnea (adult) (pediatric): Secondary | ICD-10-CM | POA: Diagnosis not present

## 2017-04-16 DIAGNOSIS — G4733 Obstructive sleep apnea (adult) (pediatric): Secondary | ICD-10-CM | POA: Diagnosis not present

## 2017-04-26 DIAGNOSIS — I10 Essential (primary) hypertension: Secondary | ICD-10-CM | POA: Diagnosis not present

## 2017-04-26 DIAGNOSIS — Z6838 Body mass index (BMI) 38.0-38.9, adult: Secondary | ICD-10-CM | POA: Diagnosis not present

## 2017-04-26 DIAGNOSIS — E039 Hypothyroidism, unspecified: Secondary | ICD-10-CM | POA: Diagnosis not present

## 2017-04-26 DIAGNOSIS — R000 Tachycardia, unspecified: Secondary | ICD-10-CM | POA: Diagnosis not present

## 2017-04-27 ENCOUNTER — Telehealth: Payer: Self-pay

## 2017-04-27 NOTE — Telephone Encounter (Signed)
Notes sent to scheduling.   

## 2017-05-03 DIAGNOSIS — N958 Other specified menopausal and perimenopausal disorders: Secondary | ICD-10-CM | POA: Diagnosis not present

## 2017-05-03 DIAGNOSIS — Z6829 Body mass index (BMI) 29.0-29.9, adult: Secondary | ICD-10-CM | POA: Diagnosis not present

## 2017-05-03 DIAGNOSIS — Z124 Encounter for screening for malignant neoplasm of cervix: Secondary | ICD-10-CM | POA: Diagnosis not present

## 2017-05-03 DIAGNOSIS — Z1231 Encounter for screening mammogram for malignant neoplasm of breast: Secondary | ICD-10-CM | POA: Diagnosis not present

## 2017-05-03 DIAGNOSIS — M8588 Other specified disorders of bone density and structure, other site: Secondary | ICD-10-CM | POA: Diagnosis not present

## 2017-05-11 DIAGNOSIS — G4733 Obstructive sleep apnea (adult) (pediatric): Secondary | ICD-10-CM | POA: Diagnosis not present

## 2017-05-18 ENCOUNTER — Encounter: Payer: Self-pay | Admitting: Internal Medicine

## 2017-05-18 ENCOUNTER — Ambulatory Visit (INDEPENDENT_AMBULATORY_CARE_PROVIDER_SITE_OTHER): Payer: PPO | Admitting: Internal Medicine

## 2017-05-18 VITALS — BP 147/82 | HR 62 | Ht 64.2 in | Wt 222.8 lb

## 2017-05-18 DIAGNOSIS — I10 Essential (primary) hypertension: Secondary | ICD-10-CM

## 2017-05-18 DIAGNOSIS — Z8249 Family history of ischemic heart disease and other diseases of the circulatory system: Secondary | ICD-10-CM

## 2017-05-18 DIAGNOSIS — Z789 Other specified health status: Secondary | ICD-10-CM

## 2017-05-18 DIAGNOSIS — R002 Palpitations: Secondary | ICD-10-CM

## 2017-05-18 DIAGNOSIS — R079 Chest pain, unspecified: Secondary | ICD-10-CM

## 2017-05-18 NOTE — Patient Instructions (Signed)
Dr. Debara Pickett has ordered a Lexiscan Myocardial Perfusion Imaging Study.  Please arrive 15 minutes prior to your appointment time for registration and insurance purposes.   The test will take approximately 3 to 4 hours to complete; you may bring reading material.  If someone comes with you to your appointment, they will need to remain in the main lobby due to limited space in the testing area. **If you are pregnant or breastfeeding, please notify the nuclear lab prior to your appointment**   How to prepare for your Myocardial Perfusion Test:  Do not eat or drink 3 hours prior to your test, except you may have water.  Do not consume products containing caffeine (regular or decaffeinated) 12 hours prior to your test. (ex: coffee, chocolate, sodas, tea).  Do wear comfortable clothes (no dresses or overalls) and walking shoes, tennis shoes preferred (No heels or open toe shoes are allowed).  Do NOT wear cologne, perfume, aftershave, or lotions (deodorant is allowed).  If you use an inhaler, use it the AM of your test and bring it with you.   If you use a nebulizer, use it the AM of your test.   If these instructions are not followed, your test will have to be rescheduled.  Your physician recommends that you schedule a follow-up appointment with Dr. Debara Pickett after your stress test.

## 2017-05-19 ENCOUNTER — Encounter: Payer: Self-pay | Admitting: Internal Medicine

## 2017-05-19 DIAGNOSIS — Z8249 Family history of ischemic heart disease and other diseases of the circulatory system: Secondary | ICD-10-CM | POA: Insufficient documentation

## 2017-05-19 DIAGNOSIS — Z789 Other specified health status: Secondary | ICD-10-CM | POA: Insufficient documentation

## 2017-05-19 DIAGNOSIS — R079 Chest pain, unspecified: Secondary | ICD-10-CM | POA: Insufficient documentation

## 2017-05-19 DIAGNOSIS — R002 Palpitations: Secondary | ICD-10-CM | POA: Insufficient documentation

## 2017-05-19 DIAGNOSIS — I10 Essential (primary) hypertension: Secondary | ICD-10-CM | POA: Insufficient documentation

## 2017-05-19 NOTE — Progress Notes (Signed)
OFFICE CONSULT NOTE  Chief Complaint:  Chest pain, hypertension and tachycardia  Primary Care Physician: Cyndi Bender, PA-C  HPI:  Susan Thornton is a 67 y.o. female who is being seen today for the evaluation of the above complaints at the request of Fae Pippin.  Mrs. Raphael is a pleasant 67 year old female was kindly referred for evaluation of chest pain, hypertension and tachycardia.  She reports an episode of acute blood pressure elevation with associated tachycardia.  She felt her heart racing which woke her from sleep and had discomfort in the left upper chest.  The pain did not radiate.  It was described as dull in nature.  The symptoms lasted for several minutes and she took Xanax which did not clearly improve her symptoms.  She denies any symptoms with exertion or improvement with rest.  She also has history of anxiety.  Family history is significant for heart disease in her father.  Recent labs show total cholesterol 189, HDL 59, LDL 91 and triglycerides 194.  Hemoglobin A1c 6.1.  PMHx:  Past Medical History:  Diagnosis Date  . Allergy   . Anxiety   . Cancer ([place])   . Depression   . GERD (gastroesophageal reflux disease)   . History of colonic polyps 04/15/2010    Past Surgical History:  Procedure Laterality Date  . BLADDER SUSPENSION    . BREAST LUMPECTOMY  1993  . TONSILLECTOMY    . VAGINAL HYSTERECTOMY  1993    FAMHx:  Family History  Problem Relation Age of Onset  . Cancer Mother        sac in abdominal wall  . Heart disease Father   . Colon cancer Maternal Uncle 47    SOCHx:   reports that she quit smoking about 4 years ago. Her smoking use included cigarettes. She has a 40.00 pack-year smoking history. She has never used smokeless tobacco. She reports that she drinks alcohol. She reports that she does not use drugs.  ALLERGIES:  Allergies  Allergen Reactions  . Penicillins Hives    REACTION: hives    ROS: Pertinent items noted in HPI  and remainder of comprehensive ROS otherwise negative.  HOME MEDS: Current Outpatient Medications on File Prior to Visit  Medication Sig Dispense Refill  . ALPRAZolam (XANAX) 0.5 MG tablet Take 0.5 mg by mouth 2 (two) times daily as needed.     Marland Kitchen atorvastatin (LIPITOR) 40 MG tablet Take 40 mg by mouth daily.    . B Complex-C-Folic Acid (MULTIVITAMIN, STRESS FORMULA) tablet Take 1 tablet by mouth daily.      . cyclobenzaprine (FLEXERIL) 5 MG tablet Take 5 mg by mouth as needed for muscle spasms.    . ESTRADIOL PO Take by mouth daily.    . Levothyroxine Sodium 25 MCG CAPS Take by mouth daily before breakfast.    . losartan (COZAAR) 100 MG tablet Take 100 mg by mouth daily.    . metoprolol succinate (TOPROL-XL) 50 MG 24 hr tablet Take 50 mg by mouth daily. Take with or immediately following a meal.    . omeprazole (PRILOSEC) 40 MG capsule Take 40 mg by mouth as needed.    . zolpidem (AMBIEN) 10 MG tablet Take 10 mg by mouth at bedtime.     No current facility-administered medications on file prior to visit.     LABS/IMAGING: No results found for this or any previous visit (from the past 48 hour(s)). No results found.  LIPID PANEL: No results found  for: CHOL, TRIG, HDL, CHOLHDL, VLDL, LDLCALC, LDLDIRECT  WEIGHTS: Wt Readings from Last 3 Encounters:  05/18/17 222 lb 12.8 oz (101.1 kg)  04/30/14 220 lb (99.8 kg)  04/16/14 220 lb (99.8 kg)    VITALS: BP (!) 147/82 (BP Location: Left Arm)   Pulse 62   Ht 5' 4.2" (1.631 m)   Wt 222 lb 12.8 oz (101.1 kg)   BMI 38.01 kg/m   EXAM: General appearance: alert and no distress Neck: no carotid bruit, no JVD and thyroid not enlarged, symmetric, no tenderness/mass/nodules Lungs: clear to auscultation bilaterally Heart: regular rate and rhythm Abdomen: soft, non-tender; bowel sounds normal; no masses,  no organomegaly Extremities: extremities normal, atraumatic, no cyanosis or edema Pulses: 2+ and symmetric Skin: Skin color, texture,  turgor normal. No rashes or lesions Neurologic: Grossly normal Psych: Pleasant  EKG: Sinus rhythm at 62 - personally reviewed  ASSESSMENT: 1. Atypical chest pain 2. Palpitations/tachycardia 3. Anxiety  PLAN: 1.   Ms. Robart is describing atypical chest pain, palpitations and tachycardia.  I suspect anxiety is playing a role in this.  Her EKG is normal.  There is a family history of heart disease, though in her father and she has dyslipidemia.  Blood pressures have been elevated, which is unusual for her.  This could suggest ischemia.  I like for her to have a stress test, although she reports problems with her hips and knees and does not feel that she can exercise on a treadmill.  We will therefore order a [place].  Plan follow-up with me afterwards.  Thanks again for the kind referral.  Pixie Casino, MD, FACC, [place] Director of the Advanced Lipid Disorders &  Cardiovascular Risk Reduction Clinic Diplomate of the American Board of Clinical Lipidology Attending Cardiologist  Direct Dial: [phone]  Fax: [phone]  Website:  [email or web address] 05/19/2017, 6:19 PM

## 2017-05-20 ENCOUNTER — Telehealth (HOSPITAL_COMMUNITY): Payer: Self-pay | Admitting: *Deleted

## 2017-05-20 NOTE — Telephone Encounter (Signed)
Close encounter 

## 2017-05-25 ENCOUNTER — Ambulatory Visit (HOSPITAL_COMMUNITY)
Admission: RE | Admit: 2017-05-25 | Discharge: 2017-05-25 | Disposition: A | Discharge status: Home / Self Care | Payer: PPO | Source: Ambulatory Visit | Attending: Internal Medicine | Admitting: Internal Medicine

## 2017-05-25 DIAGNOSIS — Z789 Other specified health status: Secondary | ICD-10-CM | POA: Diagnosis not present

## 2017-05-25 DIAGNOSIS — I259 Chronic ischemic heart disease, unspecified: Secondary | ICD-10-CM | POA: Diagnosis not present

## 2017-05-25 DIAGNOSIS — Z8249 Family history of ischemic heart disease and other diseases of the circulatory system: Secondary | ICD-10-CM | POA: Diagnosis not present

## 2017-05-25 DIAGNOSIS — R079 Chest pain, unspecified: Secondary | ICD-10-CM | POA: Insufficient documentation

## 2017-05-25 DIAGNOSIS — R002 Palpitations: Secondary | ICD-10-CM | POA: Insufficient documentation

## 2017-05-25 DIAGNOSIS — I10 Essential (primary) hypertension: Secondary | ICD-10-CM | POA: Insufficient documentation

## 2017-05-25 LAB — MYOCARDIAL PERFUSION IMAGING
CHL CUP NUCLEAR SDS: 5
CHL CUP RESTING HR STRESS: 57 {beats}/min
LV dias vol: 123 mL (ref 46–106)
LV sys vol: 55 mL
Peak HR: 95 {beats}/min
SRS: 2
SSS: 7
TID: 1.3

## 2017-05-25 MED ORDER — TECHNETIUM TC 99M TETROFOSMIN IV KIT
31.0000 | PACK | Freq: Once | INTRAVENOUS | Status: AC | PRN
  Administered 2017-05-25: 31 via INTRAVENOUS
  Filled 2017-05-25: qty 31

## 2017-05-25 MED ORDER — TECHNETIUM TC 99M TETROFOSMIN IV KIT
10.9000 | PACK | Freq: Once | INTRAVENOUS | Status: AC | PRN
Start: 2017-05-25 — End: 2017-05-25
  Administered 2017-05-25: 10.9 via INTRAVENOUS
  Filled 2017-05-25: qty 11

## 2017-05-25 MED ORDER — REGADENOSON 0.4 MG/5ML IV SOLN
0.4000 mg | Freq: Once | INTRAVENOUS | Status: AC
  Administered 2017-05-25: 0.4 mg via INTRAVENOUS

## 2017-06-10 DIAGNOSIS — G4733 Obstructive sleep apnea (adult) (pediatric): Secondary | ICD-10-CM | POA: Diagnosis not present

## 2017-06-16 ENCOUNTER — Encounter: Payer: Self-pay | Admitting: Internal Medicine

## 2017-06-16 ENCOUNTER — Ambulatory Visit (INDEPENDENT_AMBULATORY_CARE_PROVIDER_SITE_OTHER): Payer: PPO | Admitting: Internal Medicine

## 2017-06-16 VITALS — BP 193/83 | HR 66 | Ht 64.5 in | Wt 224.0 lb

## 2017-06-16 DIAGNOSIS — Z8249 Family history of ischemic heart disease and other diseases of the circulatory system: Secondary | ICD-10-CM | POA: Diagnosis not present

## 2017-06-16 DIAGNOSIS — I10 Essential (primary) hypertension: Secondary | ICD-10-CM

## 2017-06-16 DIAGNOSIS — R079 Chest pain, unspecified: Secondary | ICD-10-CM | POA: Diagnosis not present

## 2017-06-16 MED ORDER — AMLODIPINE BESYLATE 5 MG PO TABS: 5.0000 mg | ORAL_TABLET | Freq: Every day | ORAL | 3 refills | Status: DC

## 2017-06-16 NOTE — Patient Instructions (Signed)
Your physician has recommended you make the following change in your medication: START amlodipine 5mg  once daily  Your physician recommends that you schedule a follow-up appointment as needed with Dr. Debara Pickett

## 2017-06-16 NOTE — Progress Notes (Signed)
OFFICE CONSULT NOTE  Chief Complaint:  Follow-up stress test  Primary Care Physician: Susan Bender, PA-C  HPI:  Susan Thornton is a 67 y.o. female who is being seen today for the evaluation of the above complaints at the request of Susan Thornton.  Susan Thornton is a pleasant 67 year old female was kindly referred for evaluation of chest pain, hypertension and tachycardia.  She reports an episode of acute blood pressure elevation with associated tachycardia.  She felt her heart racing which woke her from sleep and had discomfort in the left upper chest.  The pain did not radiate.  It was described as dull in nature.  The symptoms lasted for several minutes and she took Xanax which did not clearly improve her symptoms.  She denies any symptoms with exertion or improvement with rest.  She also has history of anxiety.  Family history is significant for heart disease in her father.  Recent labs show total cholesterol 189, HDL 59, LDL 91 and triglycerides 194.  Hemoglobin A1c 6.1.  06/16/2017  Susan Thornton returns today for follow-up of her stress test.  This was negative for ischemia and showed a normal LVEF of 55%.  She reports her chest discomfort has significantly improved if not almost totally gone away.  Blood pressure however remains elevated.  Today her blood pressure is 193/83.  At home her blood pressure has been elevated as well.  This could be contributing to her chest pain symptoms.  He is currently on medication including losartan 100 mg daily and metoprolol succinate 50 mg daily.  We discussed several options about how to better manage her blood pressure, but I suspect she is going to need another agent.  PMHx:  Past Medical History:  Diagnosis Date  . Allergy   . Anxiety   . Cancer ([place])   . Depression   . GERD (gastroesophageal reflux disease)   . History of colonic polyps 04/15/2010    Past Surgical History:  Procedure Laterality Date  . BLADDER SUSPENSION    .  BREAST LUMPECTOMY  1993  . TONSILLECTOMY    . VAGINAL HYSTERECTOMY  1993    FAMHx:  Family History  Problem Relation Age of Onset  . Cancer Mother        sac in abdominal wall  . Heart disease Father   . Colon cancer Maternal Uncle 33    SOCHx:   reports that she quit smoking about 4 years ago. Her smoking use included cigarettes. She has a 40.00 pack-year smoking history. She has never used smokeless tobacco. She reports that she drinks alcohol. She reports that she does not use drugs.  ALLERGIES:  Allergies  Allergen Reactions  . Penicillins Hives    REACTION: hives    ROS: Pertinent items noted in HPI and remainder of comprehensive ROS otherwise negative.  HOME MEDS: Current Outpatient Medications on File Prior to Visit  Medication Sig Dispense Refill  . ALPRAZolam (XANAX) 0.5 MG tablet Take 0.5 mg by mouth 2 (two) times daily as needed.     Marland Kitchen atorvastatin (LIPITOR) 40 MG tablet Take 40 mg by mouth daily.    . B Complex-C-Folic Acid (MULTIVITAMIN, STRESS FORMULA) tablet Take 1 tablet by mouth daily.      . cyclobenzaprine (FLEXERIL) 5 MG tablet Take 5 mg by mouth as needed for muscle spasms.    . ESTRADIOL PO Take by mouth daily.    . Levothyroxine Sodium 25 MCG CAPS Take by mouth daily before breakfast.    .  losartan (COZAAR) 100 MG tablet Take 100 mg by mouth daily.    . metoprolol succinate (TOPROL-XL) 50 MG 24 hr tablet Take 50 mg by mouth daily. Take with or immediately following a meal.    . omeprazole (PRILOSEC) 40 MG capsule Take 40 mg by mouth as needed.    . zolpidem (AMBIEN) 10 MG tablet Take 10 mg by mouth at bedtime.     No current facility-administered medications on file prior to visit.     LABS/IMAGING: No results found for this or any previous visit (from the past 48 hour(s)). No results found.  LIPID PANEL: No results found for: CHOL, TRIG, HDL, CHOLHDL, VLDL, LDLCALC, LDLDIRECT  WEIGHTS: Wt Readings from Last 3 Encounters:  06/16/17 224 lb  (101.6 kg)  05/25/17 222 lb (100.7 kg)  05/18/17 222 lb 12.8 oz (101.1 kg)    VITALS: BP (!) 193/83   Pulse 66   Ht 5' 4.5" (1.638 m)   Wt 224 lb (101.6 kg)   BMI 37.86 kg/m   EXAM: Deferred  EKG: Deferred  ASSESSMENT: 1. Atypical chest pain -low risk Myoview stress test, LVEF 55% (05/2017) 2. Essential hypertension-uncontrolled 3. Palpitations/tachycardia 4. Anxiety  PLAN: 1.   Ms. Thornton continues to struggle with elevated blood pressures despite her current blood pressure medications.  Fortunately her Myoview stress test was negative and her chest pain has significantly improved if not almost totally gone away.  Blood pressure may be playing a role in some of her symptoms and I would recommend additional treatment.  We will start amlodipine 5 mg daily in addition to her current medications.  She is to monitor blood pressure closely and can follow-up with her PCP for further medication adjustments.  Thank you for allowing me to participate in her care.  I am happy to see her back as needed.  Pixie Casino, MD, [facility], [place] Director of the Advanced Lipid Disorders &  Cardiovascular Risk Reduction Clinic Diplomate of the American Board of Clinical Lipidology Attending Cardiologist  Direct Dial: [phone]  Fax: [phone]  Website:  [email or web address] 06/16/2017, 10:52 AM

## 2017-06-21 DIAGNOSIS — I10 Essential (primary) hypertension: Secondary | ICD-10-CM | POA: Diagnosis not present

## 2017-07-11 DIAGNOSIS — G4733 Obstructive sleep apnea (adult) (pediatric): Secondary | ICD-10-CM | POA: Diagnosis not present

## 2017-07-20 DIAGNOSIS — G4733 Obstructive sleep apnea (adult) (pediatric): Secondary | ICD-10-CM | POA: Diagnosis not present

## 2017-08-10 DIAGNOSIS — G4733 Obstructive sleep apnea (adult) (pediatric): Secondary | ICD-10-CM | POA: Diagnosis not present

## 2017-09-10 DIAGNOSIS — G4733 Obstructive sleep apnea (adult) (pediatric): Secondary | ICD-10-CM | POA: Diagnosis not present

## 2017-10-05 DIAGNOSIS — K219 Gastro-esophageal reflux disease without esophagitis: Secondary | ICD-10-CM | POA: Diagnosis not present

## 2017-10-05 DIAGNOSIS — Z139 Encounter for screening, unspecified: Secondary | ICD-10-CM | POA: Diagnosis not present

## 2017-10-05 DIAGNOSIS — E039 Hypothyroidism, unspecified: Secondary | ICD-10-CM | POA: Diagnosis not present

## 2017-10-05 DIAGNOSIS — I10 Essential (primary) hypertension: Secondary | ICD-10-CM | POA: Diagnosis not present

## 2017-10-05 DIAGNOSIS — Z23 Encounter for immunization: Secondary | ICD-10-CM | POA: Diagnosis not present

## 2017-10-05 DIAGNOSIS — Z9181 History of falling: Secondary | ICD-10-CM | POA: Diagnosis not present

## 2017-10-05 DIAGNOSIS — G4700 Insomnia, unspecified: Secondary | ICD-10-CM | POA: Diagnosis not present

## 2017-10-05 DIAGNOSIS — G4762 Sleep related leg cramps: Secondary | ICD-10-CM | POA: Diagnosis not present

## 2017-10-05 DIAGNOSIS — E7800 Pure hypercholesterolemia, unspecified: Secondary | ICD-10-CM | POA: Diagnosis not present

## 2017-10-05 DIAGNOSIS — Z1339 Encounter for screening examination for other mental health and behavioral disorders: Secondary | ICD-10-CM | POA: Diagnosis not present

## 2017-10-05 DIAGNOSIS — G4733 Obstructive sleep apnea (adult) (pediatric): Secondary | ICD-10-CM | POA: Diagnosis not present

## 2017-10-05 DIAGNOSIS — R7303 Prediabetes: Secondary | ICD-10-CM | POA: Diagnosis not present

## 2017-10-21 DIAGNOSIS — G4733 Obstructive sleep apnea (adult) (pediatric): Secondary | ICD-10-CM | POA: Diagnosis not present

## 2017-11-08 DIAGNOSIS — R87610 Atypical squamous cells of undetermined significance on cytologic smear of cervix (ASC-US): Secondary | ICD-10-CM | POA: Diagnosis not present

## 2017-11-08 DIAGNOSIS — Z0142 Encounter for cervical smear to confirm findings of recent normal smear following initial abnormal smear: Secondary | ICD-10-CM | POA: Diagnosis not present

## 2017-12-27 DIAGNOSIS — Z136 Encounter for screening for cardiovascular disorders: Secondary | ICD-10-CM | POA: Diagnosis not present

## 2017-12-27 DIAGNOSIS — E785 Hyperlipidemia, unspecified: Secondary | ICD-10-CM | POA: Diagnosis not present

## 2017-12-27 DIAGNOSIS — Z9181 History of falling: Secondary | ICD-10-CM | POA: Diagnosis not present

## 2017-12-27 DIAGNOSIS — Z6839 Body mass index (BMI) 39.0-39.9, adult: Secondary | ICD-10-CM | POA: Diagnosis not present

## 2017-12-27 DIAGNOSIS — Z1331 Encounter for screening for depression: Secondary | ICD-10-CM | POA: Diagnosis not present

## 2017-12-27 DIAGNOSIS — E669 Obesity, unspecified: Secondary | ICD-10-CM | POA: Diagnosis not present

## 2017-12-27 DIAGNOSIS — Z0000 Encounter for general adult medical examination without abnormal findings: Secondary | ICD-10-CM | POA: Diagnosis not present

## 2018-01-04 DIAGNOSIS — H2513 Age-related nuclear cataract, bilateral: Secondary | ICD-10-CM | POA: Diagnosis not present

## 2018-01-04 DIAGNOSIS — E119 Type 2 diabetes mellitus without complications: Secondary | ICD-10-CM | POA: Diagnosis not present

## 2018-01-24 DIAGNOSIS — G4733 Obstructive sleep apnea (adult) (pediatric): Secondary | ICD-10-CM | POA: Diagnosis not present

## 2018-04-05 DIAGNOSIS — E7800 Pure hypercholesterolemia, unspecified: Secondary | ICD-10-CM | POA: Diagnosis not present

## 2018-04-05 DIAGNOSIS — E039 Hypothyroidism, unspecified: Secondary | ICD-10-CM | POA: Diagnosis not present

## 2018-04-05 DIAGNOSIS — Z6836 Body mass index (BMI) 36.0-36.9, adult: Secondary | ICD-10-CM | POA: Diagnosis not present

## 2018-04-05 DIAGNOSIS — R7303 Prediabetes: Secondary | ICD-10-CM | POA: Diagnosis not present

## 2018-04-05 DIAGNOSIS — G4762 Sleep related leg cramps: Secondary | ICD-10-CM | POA: Diagnosis not present

## 2018-04-05 DIAGNOSIS — I10 Essential (primary) hypertension: Secondary | ICD-10-CM | POA: Diagnosis not present

## 2018-04-05 DIAGNOSIS — G4700 Insomnia, unspecified: Secondary | ICD-10-CM | POA: Diagnosis not present

## 2018-04-05 DIAGNOSIS — Z6838 Body mass index (BMI) 38.0-38.9, adult: Secondary | ICD-10-CM | POA: Diagnosis not present

## 2018-04-05 DIAGNOSIS — Z139 Encounter for screening, unspecified: Secondary | ICD-10-CM | POA: Diagnosis not present

## 2018-04-05 DIAGNOSIS — G4733 Obstructive sleep apnea (adult) (pediatric): Secondary | ICD-10-CM | POA: Diagnosis not present

## 2018-04-05 DIAGNOSIS — Z9181 History of falling: Secondary | ICD-10-CM | POA: Diagnosis not present

## 2018-04-05 DIAGNOSIS — K219 Gastro-esophageal reflux disease without esophagitis: Secondary | ICD-10-CM | POA: Diagnosis not present

## 2018-04-25 DIAGNOSIS — L0390 Cellulitis, unspecified: Secondary | ICD-10-CM | POA: Diagnosis not present

## 2018-04-25 DIAGNOSIS — R109 Unspecified abdominal pain: Secondary | ICD-10-CM | POA: Diagnosis not present

## 2018-04-25 DIAGNOSIS — L700 Acne vulgaris: Secondary | ICD-10-CM | POA: Diagnosis not present

## 2018-04-25 DIAGNOSIS — Z23 Encounter for immunization: Secondary | ICD-10-CM | POA: Diagnosis not present

## 2018-04-25 DIAGNOSIS — Z79899 Other long term (current) drug therapy: Secondary | ICD-10-CM | POA: Diagnosis not present

## 2018-04-25 DIAGNOSIS — G4733 Obstructive sleep apnea (adult) (pediatric): Secondary | ICD-10-CM | POA: Diagnosis not present

## 2018-06-01 ENCOUNTER — Other Ambulatory Visit: Payer: Self-pay | Admitting: Internal Medicine

## 2018-07-11 DIAGNOSIS — Z6834 Body mass index (BMI) 34.0-34.9, adult: Secondary | ICD-10-CM | POA: Diagnosis not present

## 2018-07-11 DIAGNOSIS — Z124 Encounter for screening for malignant neoplasm of cervix: Secondary | ICD-10-CM | POA: Diagnosis not present

## 2018-07-11 DIAGNOSIS — R351 Nocturia: Secondary | ICD-10-CM | POA: Diagnosis not present

## 2018-07-11 DIAGNOSIS — Z0142 Encounter for cervical smear to confirm findings of recent normal smear following initial abnormal smear: Secondary | ICD-10-CM | POA: Diagnosis not present

## 2018-07-11 DIAGNOSIS — Z1231 Encounter for screening mammogram for malignant neoplasm of breast: Secondary | ICD-10-CM | POA: Diagnosis not present

## 2018-07-25 DIAGNOSIS — G4733 Obstructive sleep apnea (adult) (pediatric): Secondary | ICD-10-CM | POA: Diagnosis not present

## 2018-09-06 ENCOUNTER — Other Ambulatory Visit: Payer: Self-pay | Admitting: Internal Medicine

## 2018-10-06 DIAGNOSIS — G4733 Obstructive sleep apnea (adult) (pediatric): Secondary | ICD-10-CM | POA: Diagnosis not present

## 2018-10-06 DIAGNOSIS — E039 Hypothyroidism, unspecified: Secondary | ICD-10-CM | POA: Diagnosis not present

## 2018-10-06 DIAGNOSIS — Z23 Encounter for immunization: Secondary | ICD-10-CM | POA: Diagnosis not present

## 2018-10-06 DIAGNOSIS — R7303 Prediabetes: Secondary | ICD-10-CM | POA: Diagnosis not present

## 2018-10-06 DIAGNOSIS — G4762 Sleep related leg cramps: Secondary | ICD-10-CM | POA: Diagnosis not present

## 2018-10-06 DIAGNOSIS — G4700 Insomnia, unspecified: Secondary | ICD-10-CM | POA: Diagnosis not present

## 2018-10-06 DIAGNOSIS — E7800 Pure hypercholesterolemia, unspecified: Secondary | ICD-10-CM | POA: Diagnosis not present

## 2018-10-06 DIAGNOSIS — Z6834 Body mass index (BMI) 34.0-34.9, adult: Secondary | ICD-10-CM | POA: Diagnosis not present

## 2018-10-06 DIAGNOSIS — K219 Gastro-esophageal reflux disease without esophagitis: Secondary | ICD-10-CM | POA: Diagnosis not present

## 2018-10-06 DIAGNOSIS — I10 Essential (primary) hypertension: Secondary | ICD-10-CM | POA: Diagnosis not present

## 2018-10-25 DIAGNOSIS — G4733 Obstructive sleep apnea (adult) (pediatric): Secondary | ICD-10-CM | POA: Diagnosis not present

## 2019-01-02 DIAGNOSIS — E785 Hyperlipidemia, unspecified: Secondary | ICD-10-CM | POA: Diagnosis not present

## 2019-01-02 DIAGNOSIS — Z1331 Encounter for screening for depression: Secondary | ICD-10-CM | POA: Diagnosis not present

## 2019-01-02 DIAGNOSIS — Z6834 Body mass index (BMI) 34.0-34.9, adult: Secondary | ICD-10-CM | POA: Diagnosis not present

## 2019-01-02 DIAGNOSIS — Z9181 History of falling: Secondary | ICD-10-CM | POA: Diagnosis not present

## 2019-01-02 DIAGNOSIS — Z0000 Encounter for general adult medical examination without abnormal findings: Secondary | ICD-10-CM | POA: Diagnosis not present

## 2019-01-25 DIAGNOSIS — G4733 Obstructive sleep apnea (adult) (pediatric): Secondary | ICD-10-CM | POA: Diagnosis not present

## 2019-04-05 DIAGNOSIS — Z6836 Body mass index (BMI) 36.0-36.9, adult: Secondary | ICD-10-CM | POA: Diagnosis not present

## 2019-04-05 DIAGNOSIS — I10 Essential (primary) hypertension: Secondary | ICD-10-CM | POA: Diagnosis not present

## 2019-04-05 DIAGNOSIS — J309 Allergic rhinitis, unspecified: Secondary | ICD-10-CM | POA: Diagnosis not present

## 2019-04-05 DIAGNOSIS — E039 Hypothyroidism, unspecified: Secondary | ICD-10-CM | POA: Diagnosis not present

## 2019-04-05 DIAGNOSIS — G4700 Insomnia, unspecified: Secondary | ICD-10-CM | POA: Diagnosis not present

## 2019-04-05 DIAGNOSIS — G4762 Sleep related leg cramps: Secondary | ICD-10-CM | POA: Diagnosis not present

## 2019-04-05 DIAGNOSIS — K219 Gastro-esophageal reflux disease without esophagitis: Secondary | ICD-10-CM | POA: Diagnosis not present

## 2019-04-05 DIAGNOSIS — E7800 Pure hypercholesterolemia, unspecified: Secondary | ICD-10-CM | POA: Diagnosis not present

## 2019-04-05 DIAGNOSIS — G4733 Obstructive sleep apnea (adult) (pediatric): Secondary | ICD-10-CM | POA: Diagnosis not present

## 2019-04-05 DIAGNOSIS — R7303 Prediabetes: Secondary | ICD-10-CM | POA: Diagnosis not present

## 2019-04-26 DIAGNOSIS — G4733 Obstructive sleep apnea (adult) (pediatric): Secondary | ICD-10-CM | POA: Diagnosis not present

## 2019-06-15 ENCOUNTER — Ambulatory Visit (AMBULATORY_SURGERY_CENTER): Payer: Self-pay | Admitting: *Deleted

## 2019-06-15 ENCOUNTER — Other Ambulatory Visit: Payer: Self-pay

## 2019-06-15 VITALS — Ht 64.5 in | Wt 223.0 lb

## 2019-06-15 DIAGNOSIS — Z86010 Personal history of colonic polyps: Secondary | ICD-10-CM

## 2019-06-15 DIAGNOSIS — Z01818 Encounter for other preprocedural examination: Secondary | ICD-10-CM

## 2019-06-15 NOTE — Progress Notes (Signed)

## 2019-06-21 ENCOUNTER — Encounter: Payer: Self-pay | Admitting: Internal Medicine

## 2019-06-27 ENCOUNTER — Other Ambulatory Visit: Payer: Self-pay | Admitting: Internal Medicine

## 2019-06-27 ENCOUNTER — Ambulatory Visit (INDEPENDENT_AMBULATORY_CARE_PROVIDER_SITE_OTHER): Payer: PPO

## 2019-06-27 DIAGNOSIS — Z1159 Encounter for screening for other viral diseases: Secondary | ICD-10-CM | POA: Diagnosis not present

## 2019-06-27 LAB — SARS CORONAVIRUS 2 (TAT 6-24 HRS): SARS Coronavirus 2: NEGATIVE

## 2019-06-28 ENCOUNTER — Encounter: Payer: Self-pay | Admitting: Certified Registered Nurse Anesthetist

## 2019-06-29 ENCOUNTER — Other Ambulatory Visit: Payer: Self-pay

## 2019-06-29 ENCOUNTER — Encounter: Payer: Self-pay | Admitting: Internal Medicine

## 2019-06-29 ENCOUNTER — Ambulatory Visit (AMBULATORY_SURGERY_CENTER): Payer: PPO | Admitting: Internal Medicine

## 2019-06-29 VITALS — BP 142/62 | HR 59 | Temp 97.5°F | Resp 21 | Ht 64.5 in | Wt 223.0 lb

## 2019-06-29 DIAGNOSIS — I10 Essential (primary) hypertension: Secondary | ICD-10-CM | POA: Diagnosis not present

## 2019-06-29 DIAGNOSIS — Z86010 Personal history of colonic polyps: Secondary | ICD-10-CM

## 2019-06-29 DIAGNOSIS — G4733 Obstructive sleep apnea (adult) (pediatric): Secondary | ICD-10-CM | POA: Diagnosis not present

## 2019-06-29 DIAGNOSIS — K219 Gastro-esophageal reflux disease without esophagitis: Secondary | ICD-10-CM | POA: Diagnosis not present

## 2019-06-29 MED ORDER — SODIUM CHLORIDE 0.9 % IV SOLN: 500.0000 mL | Freq: Once | INTRAVENOUS | Status: DC

## 2019-06-29 NOTE — Patient Instructions (Addendum)
No polyps today!  Your next routine colonoscopy should be in 5 years - 2026.  I appreciate the opportunity to care for you. Gatha Mayer, MD, [facility]  Handouts Provided:  Diverticulosis  YOU HAD AN ENDOSCOPIC PROCEDURE TODAY AT [place]:   Refer to the procedure report that was given to you for any specific questions about what was found during the examination.  If the procedure report does not answer your questions, please call your gastroenterologist to clarify.  If you requested that your care partner not be given the details of your procedure findings, then the procedure report has been included in a sealed envelope for you to review at your convenience later.  YOU SHOULD EXPECT: Some feelings of bloating in the abdomen. Passage of more gas than usual.  Walking can help get rid of the air that was put into your GI tract during the procedure and reduce the bloating. If you had a lower endoscopy (such as a colonoscopy or flexible sigmoidoscopy) you may notice spotting of blood in your stool or on the toilet paper. If you underwent a bowel prep for your procedure, you may not have a normal bowel movement for a few days.  Please Note:  You might notice some irritation and congestion in your nose or some drainage.  This is from the oxygen used during your procedure.  There is no need for concern and it should clear up in a day or so.  SYMPTOMS TO REPORT IMMEDIATELY:   Following lower endoscopy (colonoscopy or flexible sigmoidoscopy):  Excessive amounts of blood in the stool  Significant tenderness or worsening of abdominal pains  Swelling of the abdomen that is new, acute  Fever of 100F or higher  For urgent or emergent issues, a gastroenterologist can be reached at any hour by calling [phone]. Do not use MyChart messaging for urgent concerns.    DIET:  We do recommend a small meal at first, but then you may proceed to your regular diet.  Drink plenty of fluids  but you should avoid alcoholic beverages for 24 hours.  ACTIVITY:  You should plan to take it easy for the rest of today and you should NOT DRIVE or use heavy machinery until tomorrow (because of the sedation medicines used during the test).    FOLLOW UP: Our staff will call the number listed on your records 48-72 hours following your procedure to check on you and address any questions or concerns that you may have regarding the information given to you following your procedure. If we do not reach you, we will leave a message.  We will attempt to reach you two times.  During this call, we will ask if you have developed any symptoms of COVID 19. If you develop any symptoms (ie: fever, flu-like symptoms, shortness of breath, cough etc.) before then, please call [phone].  If you test positive for Covid 19 in the 2 weeks post procedure, please call and report this information to [place].    If any biopsies were taken you will be contacted by phone or by letter within the next 1-3 weeks.  Please call us at [phone] if you have not heard about the biopsies in 3 weeks.    SIGNATURES/CONFIDENTIALITY: You and/or your care partner have signed paperwork which will be entered into your electronic medical record.  These signatures attest to the fact that that the information above on your After Visit Summary has been reviewed and is understood.  Full responsibility of the confidentiality of this discharge information lies with you and/or your care-partner.

## 2019-06-29 NOTE — Progress Notes (Signed)
Report given to PACU, vss 

## 2019-06-29 NOTE — Progress Notes (Signed)
VS- Susan Thornton  covid test 06-27-19 negative  Pt's states no medical or surgical changes since previsit or office visit.

## 2019-06-29 NOTE — Op Note (Signed)
[place] Patient Name: Susan Thornton Procedure Date: 06/29/2019 8:03 AM MRN: [id number] Endoscopist: Gatha Mayer , MD Age: 69 Referring MD:  Date of Birth: 11-18-1950 Gender: Female Account #: [id number] Procedure:                Colonoscopy Indications:              Surveillance: Personal history of adenomatous                            polyps on last colonoscopy 5 years ago Medicines:                Propofol per Anesthesia, Monitored Anesthesia Care Procedure:                Pre-Anesthesia Assessment:                           - Prior to the procedure, a History and Physical                            was performed, and patient medications and                            allergies were reviewed. The patient's tolerance of                            previous anesthesia was also reviewed. The risks                            and benefits of the procedure and the sedation                            options and risks were discussed with the patient.                            All questions were answered, and informed consent                            was obtained. Prior Anticoagulants: The patient has                            taken no previous anticoagulant or antiplatelet                            agents. ASA Grade Assessment: II - A patient with                            mild systemic disease. After reviewing the risks                            and benefits, the patient was deemed in                            satisfactory condition to undergo the procedure.  After obtaining informed consent, the colonoscope                            was passed under direct vision. Throughout the                            procedure, the patient's blood pressure, pulse, and                            oxygen saturations were monitored continuously. The                            Colonoscope was introduced through the anus and                             advanced to the the cecum, identified by                            appendiceal orifice and ileocecal valve. The                            colonoscopy was performed without difficulty. The                            patient tolerated the procedure well. The quality                            of the bowel preparation was good. The ileocecal                            valve, appendiceal orifice, and rectum were                            photographed. The bowel preparation used was                            Miralax via split dose instruction. Scope In: 8:10:18 AM Scope Out: 8:23:12 AM Scope Withdrawal Time: 0 hours 10 minutes 5 seconds  Total Procedure Duration: 0 hours 12 minutes 54 seconds  Findings:                 The perianal and digital rectal examinations were                            normal.                           Multiple small and large-mouthed diverticula were                            found in the sigmoid colon.                           The exam was otherwise without abnormality on  direct and retroflexion views. Complications:            No immediate complications. Estimated Blood Loss:     Estimated blood loss was minimal. Impression:               - Diverticulosis in the sigmoid colon.                           - The examination was otherwise normal on direct                            and retroflexion views.                           - No specimens collected. Recommendation:           - Patient has a contact number available for                            emergencies. The signs and symptoms of potential                            delayed complications were discussed with the                            patient. Return to normal activities tomorrow.                            Written discharge instructions were provided to the                            patient.                           - Resume previous diet.                           -  Continue present medications.                           - Repeat colonoscopy in 5 years for surveillance. Gatha Mayer, MD 06/29/2019 8:35:13 AM This report has been signed electronically.

## 2019-07-03 ENCOUNTER — Telehealth: Payer: Self-pay

## 2019-07-03 ENCOUNTER — Telehealth: Payer: Self-pay | Admitting: *Deleted

## 2019-07-03 NOTE — Telephone Encounter (Signed)
Attempted to reach pt. With follow-up call following endoscopic procedure 06/29/19.  LM on pt. Voice mail.  Will try to reach pt. Again later today.

## 2019-07-03 NOTE — Telephone Encounter (Signed)
  Follow up Call-  Call back number 06/29/2019  Post procedure Call Back phone  # [phone]  Permission to leave phone message Yes  Some recent data might be hidden     Patient questions:  Do you have a fever, pain , or abdominal swelling? No. Pain Score  0 *  Have you tolerated food without any problems? Yes.    Have you been able to return to your normal activities? Yes.    Do you have any questions about your discharge instructions: Diet   No. Medications  No. Follow up visit  No.  Do you have questions or concerns about your Care? No.  Actions: * If pain score is 4 or above: No action needed, pain <4.  1. Have you developed a fever since your procedure? no  2.   Have you had an respiratory symptoms (SOB or cough) since your procedure? no  3.   Have you tested positive for COVID 19 since your procedure no  4.   Have you had any family members/close contacts diagnosed with the COVID 19 since your procedure?  no   If yes to any of these questions please route to Joylene John, RN and Erenest Rasher, RN

## 2019-07-26 DIAGNOSIS — G4733 Obstructive sleep apnea (adult) (pediatric): Secondary | ICD-10-CM | POA: Diagnosis not present

## 2019-09-21 DIAGNOSIS — Z139 Encounter for screening, unspecified: Secondary | ICD-10-CM | POA: Diagnosis not present

## 2019-09-21 DIAGNOSIS — Z1231 Encounter for screening mammogram for malignant neoplasm of breast: Secondary | ICD-10-CM | POA: Diagnosis not present

## 2019-09-21 DIAGNOSIS — Z6837 Body mass index (BMI) 37.0-37.9, adult: Secondary | ICD-10-CM | POA: Diagnosis not present

## 2019-09-21 DIAGNOSIS — Z124 Encounter for screening for malignant neoplasm of cervix: Secondary | ICD-10-CM | POA: Diagnosis not present

## 2019-10-12 DIAGNOSIS — Z1331 Encounter for screening for depression: Secondary | ICD-10-CM | POA: Diagnosis not present

## 2019-10-12 DIAGNOSIS — K219 Gastro-esophageal reflux disease without esophagitis: Secondary | ICD-10-CM | POA: Diagnosis not present

## 2019-10-12 DIAGNOSIS — Z139 Encounter for screening, unspecified: Secondary | ICD-10-CM | POA: Diagnosis not present

## 2019-10-12 DIAGNOSIS — Z9181 History of falling: Secondary | ICD-10-CM | POA: Diagnosis not present

## 2019-10-12 DIAGNOSIS — E7800 Pure hypercholesterolemia, unspecified: Secondary | ICD-10-CM | POA: Diagnosis not present

## 2019-10-12 DIAGNOSIS — G4700 Insomnia, unspecified: Secondary | ICD-10-CM | POA: Diagnosis not present

## 2019-10-12 DIAGNOSIS — Z6836 Body mass index (BMI) 36.0-36.9, adult: Secondary | ICD-10-CM | POA: Diagnosis not present

## 2019-10-12 DIAGNOSIS — M7062 Trochanteric bursitis, left hip: Secondary | ICD-10-CM | POA: Diagnosis not present

## 2019-10-12 DIAGNOSIS — I10 Essential (primary) hypertension: Secondary | ICD-10-CM | POA: Diagnosis not present

## 2019-10-12 DIAGNOSIS — R7303 Prediabetes: Secondary | ICD-10-CM | POA: Diagnosis not present

## 2019-10-12 DIAGNOSIS — G4733 Obstructive sleep apnea (adult) (pediatric): Secondary | ICD-10-CM | POA: Diagnosis not present

## 2019-10-12 DIAGNOSIS — E039 Hypothyroidism, unspecified: Secondary | ICD-10-CM | POA: Diagnosis not present

## 2019-10-24 ENCOUNTER — Other Ambulatory Visit: Payer: Self-pay | Admitting: Internal Medicine

## 2019-10-26 DIAGNOSIS — G4733 Obstructive sleep apnea (adult) (pediatric): Secondary | ICD-10-CM | POA: Diagnosis not present

## 2020-01-29 DIAGNOSIS — G4733 Obstructive sleep apnea (adult) (pediatric): Secondary | ICD-10-CM | POA: Diagnosis not present

## 2020-03-21 DIAGNOSIS — E669 Obesity, unspecified: Secondary | ICD-10-CM | POA: Diagnosis not present

## 2020-03-21 DIAGNOSIS — Z1331 Encounter for screening for depression: Secondary | ICD-10-CM | POA: Diagnosis not present

## 2020-03-21 DIAGNOSIS — Z0000 Encounter for general adult medical examination without abnormal findings: Secondary | ICD-10-CM | POA: Diagnosis not present

## 2020-03-21 DIAGNOSIS — Z9181 History of falling: Secondary | ICD-10-CM | POA: Diagnosis not present

## 2020-03-21 DIAGNOSIS — E785 Hyperlipidemia, unspecified: Secondary | ICD-10-CM | POA: Diagnosis not present

## 2020-04-11 DIAGNOSIS — E039 Hypothyroidism, unspecified: Secondary | ICD-10-CM | POA: Diagnosis not present

## 2020-04-11 DIAGNOSIS — Z79899 Other long term (current) drug therapy: Secondary | ICD-10-CM | POA: Diagnosis not present

## 2020-04-11 DIAGNOSIS — Z6839 Body mass index (BMI) 39.0-39.9, adult: Secondary | ICD-10-CM | POA: Diagnosis not present

## 2020-04-11 DIAGNOSIS — G4733 Obstructive sleep apnea (adult) (pediatric): Secondary | ICD-10-CM | POA: Diagnosis not present

## 2020-04-11 DIAGNOSIS — M25552 Pain in left hip: Secondary | ICD-10-CM | POA: Diagnosis not present

## 2020-04-11 DIAGNOSIS — I10 Essential (primary) hypertension: Secondary | ICD-10-CM | POA: Diagnosis not present

## 2020-04-11 DIAGNOSIS — K219 Gastro-esophageal reflux disease without esophagitis: Secondary | ICD-10-CM | POA: Diagnosis not present

## 2020-04-11 DIAGNOSIS — R7303 Prediabetes: Secondary | ICD-10-CM | POA: Diagnosis not present

## 2020-04-11 DIAGNOSIS — M7062 Trochanteric bursitis, left hip: Secondary | ICD-10-CM | POA: Diagnosis not present

## 2020-04-11 DIAGNOSIS — E7800 Pure hypercholesterolemia, unspecified: Secondary | ICD-10-CM | POA: Diagnosis not present

## 2020-04-11 DIAGNOSIS — G4700 Insomnia, unspecified: Secondary | ICD-10-CM | POA: Diagnosis not present

## 2020-04-30 DIAGNOSIS — G4733 Obstructive sleep apnea (adult) (pediatric): Secondary | ICD-10-CM | POA: Diagnosis not present

## 2020-05-15 DIAGNOSIS — Z6839 Body mass index (BMI) 39.0-39.9, adult: Secondary | ICD-10-CM | POA: Diagnosis not present

## 2020-05-15 DIAGNOSIS — M160 Bilateral primary osteoarthritis of hip: Secondary | ICD-10-CM | POA: Diagnosis not present

## 2020-05-15 DIAGNOSIS — M7062 Trochanteric bursitis, left hip: Secondary | ICD-10-CM | POA: Diagnosis not present

## 2020-05-15 DIAGNOSIS — I10 Essential (primary) hypertension: Secondary | ICD-10-CM | POA: Diagnosis not present

## 2020-07-31 DIAGNOSIS — G4733 Obstructive sleep apnea (adult) (pediatric): Secondary | ICD-10-CM | POA: Diagnosis not present

## 2020-10-01 DIAGNOSIS — Z6839 Body mass index (BMI) 39.0-39.9, adult: Secondary | ICD-10-CM | POA: Diagnosis not present

## 2020-10-01 DIAGNOSIS — Z1231 Encounter for screening mammogram for malignant neoplasm of breast: Secondary | ICD-10-CM | POA: Diagnosis not present

## 2020-10-01 DIAGNOSIS — Z01419 Encounter for gynecological examination (general) (routine) without abnormal findings: Secondary | ICD-10-CM | POA: Diagnosis not present

## 2020-10-07 DIAGNOSIS — Z6839 Body mass index (BMI) 39.0-39.9, adult: Secondary | ICD-10-CM | POA: Diagnosis not present

## 2020-10-07 DIAGNOSIS — M7062 Trochanteric bursitis, left hip: Secondary | ICD-10-CM | POA: Diagnosis not present

## 2020-10-07 DIAGNOSIS — F418 Other specified anxiety disorders: Secondary | ICD-10-CM | POA: Diagnosis not present

## 2020-10-07 DIAGNOSIS — I10 Essential (primary) hypertension: Secondary | ICD-10-CM | POA: Diagnosis not present

## 2020-10-07 DIAGNOSIS — R609 Edema, unspecified: Secondary | ICD-10-CM | POA: Diagnosis not present

## 2020-10-31 DIAGNOSIS — G4733 Obstructive sleep apnea (adult) (pediatric): Secondary | ICD-10-CM | POA: Diagnosis not present

## 2020-11-15 DIAGNOSIS — Z79899 Other long term (current) drug therapy: Secondary | ICD-10-CM | POA: Diagnosis not present

## 2020-11-15 DIAGNOSIS — Z139 Encounter for screening, unspecified: Secondary | ICD-10-CM | POA: Diagnosis not present

## 2020-11-15 DIAGNOSIS — K219 Gastro-esophageal reflux disease without esophagitis: Secondary | ICD-10-CM | POA: Diagnosis not present

## 2020-11-15 DIAGNOSIS — R000 Tachycardia, unspecified: Secondary | ICD-10-CM | POA: Diagnosis not present

## 2020-11-15 DIAGNOSIS — R7303 Prediabetes: Secondary | ICD-10-CM | POA: Diagnosis not present

## 2020-11-15 DIAGNOSIS — I10 Essential (primary) hypertension: Secondary | ICD-10-CM | POA: Diagnosis not present

## 2020-11-15 DIAGNOSIS — Z6838 Body mass index (BMI) 38.0-38.9, adult: Secondary | ICD-10-CM | POA: Diagnosis not present

## 2020-11-15 DIAGNOSIS — F419 Anxiety disorder, unspecified: Secondary | ICD-10-CM | POA: Diagnosis not present

## 2020-11-15 DIAGNOSIS — G4700 Insomnia, unspecified: Secondary | ICD-10-CM | POA: Diagnosis not present

## 2020-11-15 DIAGNOSIS — E039 Hypothyroidism, unspecified: Secondary | ICD-10-CM | POA: Diagnosis not present

## 2020-11-15 DIAGNOSIS — G4733 Obstructive sleep apnea (adult) (pediatric): Secondary | ICD-10-CM | POA: Diagnosis not present

## 2020-11-15 DIAGNOSIS — E7800 Pure hypercholesterolemia, unspecified: Secondary | ICD-10-CM | POA: Diagnosis not present

## 2021-01-31 DIAGNOSIS — G4733 Obstructive sleep apnea (adult) (pediatric): Secondary | ICD-10-CM | POA: Diagnosis not present

## 2021-03-24 DIAGNOSIS — E669 Obesity, unspecified: Secondary | ICD-10-CM | POA: Diagnosis not present

## 2021-03-24 DIAGNOSIS — Z1331 Encounter for screening for depression: Secondary | ICD-10-CM | POA: Diagnosis not present

## 2021-03-24 DIAGNOSIS — Z9181 History of falling: Secondary | ICD-10-CM | POA: Diagnosis not present

## 2021-03-24 DIAGNOSIS — Z6838 Body mass index (BMI) 38.0-38.9, adult: Secondary | ICD-10-CM | POA: Diagnosis not present

## 2021-03-24 DIAGNOSIS — E785 Hyperlipidemia, unspecified: Secondary | ICD-10-CM | POA: Diagnosis not present

## 2021-03-24 DIAGNOSIS — Z0000 Encounter for general adult medical examination without abnormal findings: Secondary | ICD-10-CM | POA: Diagnosis not present

## 2021-05-01 DIAGNOSIS — G4733 Obstructive sleep apnea (adult) (pediatric): Secondary | ICD-10-CM | POA: Diagnosis not present

## 2021-05-16 DIAGNOSIS — M7062 Trochanteric bursitis, left hip: Secondary | ICD-10-CM | POA: Diagnosis not present

## 2021-05-16 DIAGNOSIS — G4733 Obstructive sleep apnea (adult) (pediatric): Secondary | ICD-10-CM | POA: Diagnosis not present

## 2021-05-16 DIAGNOSIS — R7303 Prediabetes: Secondary | ICD-10-CM | POA: Diagnosis not present

## 2021-05-16 DIAGNOSIS — F419 Anxiety disorder, unspecified: Secondary | ICD-10-CM | POA: Diagnosis not present

## 2021-05-16 DIAGNOSIS — E7800 Pure hypercholesterolemia, unspecified: Secondary | ICD-10-CM | POA: Diagnosis not present

## 2021-05-16 DIAGNOSIS — E039 Hypothyroidism, unspecified: Secondary | ICD-10-CM | POA: Diagnosis not present

## 2021-05-16 DIAGNOSIS — I10 Essential (primary) hypertension: Secondary | ICD-10-CM | POA: Diagnosis not present

## 2021-05-16 DIAGNOSIS — R000 Tachycardia, unspecified: Secondary | ICD-10-CM | POA: Diagnosis not present

## 2021-05-16 DIAGNOSIS — K219 Gastro-esophageal reflux disease without esophagitis: Secondary | ICD-10-CM | POA: Diagnosis not present

## 2021-05-16 DIAGNOSIS — G4700 Insomnia, unspecified: Secondary | ICD-10-CM | POA: Diagnosis not present

## 2021-05-16 DIAGNOSIS — Z79899 Other long term (current) drug therapy: Secondary | ICD-10-CM | POA: Diagnosis not present

## 2021-06-05 ENCOUNTER — Encounter: Payer: Self-pay | Admitting: *Deleted

## 2021-06-05 ENCOUNTER — Ambulatory Visit (INDEPENDENT_AMBULATORY_CARE_PROVIDER_SITE_OTHER): Payer: PPO | Admitting: Internal Medicine

## 2021-06-05 ENCOUNTER — Encounter: Payer: Self-pay | Admitting: Internal Medicine

## 2021-06-05 VITALS — BP 153/78 | HR 60 | Ht 64.0 in | Wt 217.2 lb

## 2021-06-05 DIAGNOSIS — Z9989 Dependence on other enabling machines and devices: Secondary | ICD-10-CM | POA: Diagnosis not present

## 2021-06-05 DIAGNOSIS — R002 Palpitations: Secondary | ICD-10-CM

## 2021-06-05 DIAGNOSIS — I10 Essential (primary) hypertension: Secondary | ICD-10-CM

## 2021-06-05 DIAGNOSIS — G4733 Obstructive sleep apnea (adult) (pediatric): Secondary | ICD-10-CM | POA: Diagnosis not present

## 2021-06-05 NOTE — Progress Notes (Signed)
Patient ID: Susan Thornton, female   DOB: 07/04/1950, 71 y.o.   MRN: [id number] Patient enrolled for [employer] to ship a 30 day cardiac event monitor to her address on file.  Letter with instructions mailed to patient.

## 2021-06-05 NOTE — Addendum Note (Signed)
Addended by: Fidel Levy on: 06/05/2021 08:42 AM   Modules accepted: Orders

## 2021-06-05 NOTE — Progress Notes (Signed)
OFFICE CONSULT NOTE  Chief Complaint:  Palpitations  Primary Care Physician: Cyndi Bender, PA-C  HPI:  Susan Thornton is a 71 y.o. female who is being seen today for the evaluation of the above complaints at the request of Fae Pippin.  Susan Thornton is a pleasant 71 year old female was kindly referred for evaluation of chest pain, hypertension and tachycardia.  She reports an episode of acute blood pressure elevation with associated tachycardia.  She felt her heart racing which woke her from sleep and had discomfort in the left upper chest.  The pain did not radiate.  It was described as dull in nature.  The symptoms lasted for several minutes and she took Xanax which did not clearly improve her symptoms.  She denies any symptoms with exertion or improvement with rest.  She also has history of anxiety.  Family history is significant for heart disease in her father.  Recent labs show total cholesterol 189, HDL 59, LDL 91 and triglycerides 194.  Hemoglobin A1c 6.1.  06/16/2017  Susan Thornton returns today for follow-up of her stress test.  This was negative for ischemia and showed a normal LVEF of 55%.  She reports her chest discomfort has significantly improved if not almost totally gone away.  Blood pressure however remains elevated.  Today her blood pressure is 193/83.  At home her blood pressure has been elevated as well.  This could be contributing to her chest pain symptoms.  He is currently on medication including losartan 100 mg daily and metoprolol succinate 50 mg daily.  We discussed several options about how to better manage her blood pressure, but I suspect she is going to need another agent.  06/05/2021  Susan Thornton returns today for follow-up.  She is actually considered a new patient as she was last seen Jun 16, 2017, almost 4 years ago.  She was referred back because of palpitations and tachycardia.  She gets discomfort in her chest and neck when she has these episodes.  She  said on March 30 she had an episode where she was sitting down or lying in bed in the evening and developed tachycardia spontaneously.  Her Fitbit did capture this showing heart rate over 170 which was sustained for quite a while and eventually broke.  She has had several of these episodes a month and they have been more recent.  She ready is on 100 mg of Toprol XL daily.  She has had some benefit from taking Xanax for them.  Of note her blood pressure is elevated.  She was previously on amlodipine but reports she is not taking it.  Blood pressure was 153/78 today.  More recently at her PCPs office it was 160/90.  EKG today shows a normal sinus rhythm.  She does have a history of sleep apnea on CPAP and reports compliance.  PMHx:  Past Medical History:  Diagnosis Date   Allergy    Anxiety    Cancer ([place])    pre cancerous cervical cells   Depression    GERD (gastroesophageal reflux disease)    History of colonic polyps 04/15/2010   Hyperlipidemia    Hypertension    Sleep apnea    Thyroid disease     Past Surgical History:  Procedure Laterality Date   BLADDER SUSPENSION     BREAST LUMPECTOMY  1993   COLONOSCOPY     POLYPECTOMY     TONSILLECTOMY     VAGINAL HYSTERECTOMY  1993    FAMHx:  Family History  Problem Relation Age of Onset   Cancer Mother        sac in abdominal wall   Heart disease Father    Colon cancer Maternal Uncle 85   Colon polyps Neg Hx    Esophageal cancer Neg Hx    Stomach cancer Neg Hx    Rectal cancer Neg Hx     SOCHx:   reports that she quit smoking about 8 years ago. Her smoking use included cigarettes. She has a 40.00 pack-year smoking history. She has never used smokeless tobacco. She reports current alcohol use. She reports that she does not use drugs.  ALLERGIES:  Allergies  Allergen Reactions   Penicillins Hives    REACTION: hives    ROS: Pertinent items noted in HPI and remainder of comprehensive ROS otherwise negative.  HOME  MEDS: Current Outpatient Medications on File Prior to Visit  Medication Sig Dispense Refill   ALPRAZolam (XANAX) 0.5 MG tablet Take 0.5 mg by mouth 2 (two) times daily as needed.      atorvastatin (LIPITOR) 40 MG tablet Take 40 mg by mouth daily.     azelastine (ASTELIN) 0.1 % nasal spray INSTILL 1 2 SPRAYS PER NOSTRIL DAILY AS NEEDED FOR ALLERGIES     B Complex-C-Folic Acid (MULTIVITAMIN, STRESS FORMULA) tablet Take 1 tablet by mouth daily.       cetirizine (ZYRTEC) 10 MG tablet 1 tablet     cyclobenzaprine (FLEXERIL) 5 MG tablet Take 5 mg by mouth as needed for muscle spasms.     ESTRADIOL PO Take by mouth daily.     hydrochlorothiazide (HYDRODIURIL) 25 MG tablet Take 25 mg by mouth daily.     Levothyroxine Sodium 25 MCG CAPS Take by mouth daily before breakfast.     lisinopril (ZESTRIL) 10 MG tablet 1 tablet     losartan (COZAAR) 100 MG tablet Take 100 mg by mouth daily.     meloxicam (MOBIC) 15 MG tablet Take 15 mg by mouth daily.     metoprolol succinate (TOPROL-XL) 100 MG 24 hr tablet Take 100 mg by mouth daily.     omeprazole (PRILOSEC) 40 MG capsule Take 40 mg by mouth as needed.     zolpidem (AMBIEN) 10 MG tablet Take 10 mg by mouth at bedtime.     amLODipine (NORVASC) 5 MG tablet Take 1 tablet (5 mg total) by mouth daily. NEED OV. (Patient not taking: Reported on 06/05/2021) 30 tablet 0   No current facility-administered medications on file prior to visit.    LABS/IMAGING: No results found for this or any previous visit (from the past 48 hour(s)). No results found.  LIPID PANEL: No results found for: CHOL, TRIG, HDL, CHOLHDL, VLDL, LDLCALC, LDLDIRECT  WEIGHTS: Wt Readings from Last 3 Encounters:  06/05/21 217 lb 3.2 oz (98.5 kg)  06/29/19 223 lb (101.2 kg)  06/15/19 223 lb (101.2 kg)    VITALS: BP (!) 153/78   Pulse 60   Ht '5\' 4"'$  (1.626 m)   Wt 217 lb 3.2 oz (98.5 kg)   SpO2 98%   BMI 37.28 kg/m   EXAM: General appearance: alert, no distress, and moderately  obese Neck: no carotid bruit, no JVD, and thyroid not enlarged, symmetric, no tenderness/mass/nodules Lungs: clear to auscultation bilaterally Heart: regular rate and rhythm, S1, S2 normal, no murmur, click, rub or gallop Abdomen: soft, non-tender; bowel sounds normal; no masses,  no organomegaly Extremities: extremities normal, atraumatic, no cyanosis or edema Pulses: 2+ and symmetric  Skin: Skin color, texture, turgor normal. No rashes or lesions Neurologic: Grossly normal Psych: Pleasant  EKG: Normal sinus rhythm at 60-personally reviewed  ASSESSMENT: Atypical chest pain -low risk Myoview stress test, LVEF 55% (05/2017) Essential hypertension-uncontrolled Palpitations/tachycardia Anxiety  PLAN: 1.   Susan Thornton notes ongoing high blood pressure as well as these episodes of palpitations which are paroxysmal.  Likely SVT.  We will place a 30-day monitor to see if we can further identify.  She is already on 100 mg of Toprol.  She was previously on amlodipine but no longer takes that.  Again I think she will need something for blood pressure control.  We may consider something like verapamil which may help with her rates as well.  Plan follow-up with me in about 2 months.  I have advised her to purchase a blood pressure cuff and take home blood pressure readings and contact us in a week with those so that we can further manage that.  Thanks again for referring her back.  Pixie Casino, MD, [facility], [place] Director of the Advanced Lipid Disorders &  Cardiovascular Risk Reduction Clinic Diplomate of the American Board of Clinical Lipidology Attending Cardiologist  Direct Dial: [phone]  Fax: [phone]  Website:  [email or web address] 06/05/2021, 8:16 AM

## 2021-06-05 NOTE — Patient Instructions (Signed)
Medication Instructions:  Your physician recommends that you continue on your current medications as directed. Please refer to the Current Medication list given to you today.  *If you need a refill on your cardiac medications before your next appointment, please call your pharmacy*  Testing/Procedures: Your physician has recommended that you wear an event monitor for 30 days. Event monitors are medical devices that record the heart's electrical activity. Doctors most often [place] these monitors to diagnose arrhythmias. Arrhythmias are problems with the speed or rhythm of the heartbeat. The monitor is a small, portable device. You can wear one while you do your normal daily activities. This is usually used to diagnose what is causing palpitations/syncope (passing out).    Follow-Up: At [facility], you and your health needs are our priority.  As part of our continuing mission to provide you with exceptional heart care, we have created designated Provider Care Teams.  These Care Teams include your primary Cardiologist (physician) and Advanced Practice Providers (APPs -  Physician Assistants and Nurse Practitioners) who all work together to provide you with the care you need, when you need it.  We recommend signing up for the patient portal called "MyChart".  Sign up information is provided on this After Visit Summary.  MyChart is used to connect with patients for Virtual Visits (Telemedicine).  Patients are able to view lab/test results, encounter notes, upcoming appointments, etc.  Non-urgent messages can be sent to your provider as well.   To learn more about what you can do with MyChart, go to [email or web address].    Your next appointment:   6-8 weeks with Dr. Debara Pickett or NP/PA in office for monitor follow up   Please monitor your BP at home.  HOW TO TAKE YOUR BLOOD PRESSURE: Rest 5 minutes before taking your blood pressure. Don't smoke or drink caffeinated beverages for at least 30 minutes  before. Take your blood pressure before (not after) you eat. Sit comfortably with your back supported and both feet on the floor (don't cross your legs). Elevate your arm to heart level on a table or a desk. Use the proper sized cuff. It should fit smoothly and snugly around your bare upper arm. There should be enough room to slip a fingertip under the cuff. The bottom edge of the cuff should be 1 inch above the crease of the elbow. Ideally, take 3 measurements at one sitting and record the average.

## 2021-06-15 ENCOUNTER — Encounter: Payer: Self-pay | Admitting: Internal Medicine

## 2021-06-17 MED ORDER — VERAPAMIL HCL ER 180 MG PO TBCR: 180.0000 mg | EXTENDED_RELEASE_TABLET | Freq: Every morning | ORAL | 3 refills | Status: DC

## 2021-06-17 NOTE — Telephone Encounter (Signed)
She needs additional BP control - would advise adding verapamil CR (180 mg) daily in the am. Monitor Bps.  Dr. Lemmie Evens

## 2021-06-19 ENCOUNTER — Ambulatory Visit (INDEPENDENT_AMBULATORY_CARE_PROVIDER_SITE_OTHER): Payer: PPO

## 2021-06-19 ENCOUNTER — Encounter: Payer: Self-pay | Admitting: Internal Medicine

## 2021-06-19 DIAGNOSIS — R002 Palpitations: Secondary | ICD-10-CM

## 2021-07-01 ENCOUNTER — Encounter: Payer: Self-pay | Admitting: Internal Medicine

## 2021-07-02 MED ORDER — VERAPAMIL HCL ER 240 MG PO TBCR: 240.0000 mg | EXTENDED_RELEASE_TABLET | Freq: Every day | ORAL | 3 refills | Status: DC

## 2021-07-02 NOTE — Telephone Encounter (Signed)
Dr. Debara Pickett, I spoke with patient. She has been taking verapamil 180 mg SR since May 28th. She does not have chest pain or headache. Did you still want the verapamil 180 ER ordered?

## 2021-07-02 NOTE — Telephone Encounter (Signed)
BP is indeed to high - advise starting verapamil ER 180 mg daily.  Dr. Debara Pickett

## 2021-07-18 ENCOUNTER — Encounter: Payer: Self-pay | Admitting: Internal Medicine

## 2021-07-23 ENCOUNTER — Encounter: Payer: Self-pay | Admitting: Internal Medicine

## 2021-07-23 ENCOUNTER — Ambulatory Visit (INDEPENDENT_AMBULATORY_CARE_PROVIDER_SITE_OTHER): Payer: PPO | Admitting: Internal Medicine

## 2021-07-23 VITALS — BP 152/80 | HR 58 | Ht 64.5 in | Wt 217.6 lb

## 2021-07-23 DIAGNOSIS — I480 Paroxysmal atrial fibrillation: Secondary | ICD-10-CM | POA: Diagnosis not present

## 2021-07-23 DIAGNOSIS — G4733 Obstructive sleep apnea (adult) (pediatric): Secondary | ICD-10-CM | POA: Diagnosis not present

## 2021-07-23 DIAGNOSIS — Z9989 Dependence on other enabling machines and devices: Secondary | ICD-10-CM | POA: Diagnosis not present

## 2021-07-23 DIAGNOSIS — I10 Essential (primary) hypertension: Secondary | ICD-10-CM | POA: Diagnosis not present

## 2021-07-23 MED ORDER — APIXABAN 5 MG PO TABS: 5.0000 mg | ORAL_TABLET | Freq: Two times a day (BID) | ORAL | 11 refills | Status: DC

## 2021-07-23 NOTE — Patient Instructions (Signed)
Medication Instructions:  STOP amlodipine   START eliquis '5mg'$  twice daily  AVOID use of meloxicam   *If you need a refill on your cardiac medications before your next appointment, please call your pharmacy*    Follow-Up: At [facility], you and your health needs are our priority.  As part of our continuing mission to provide you with exceptional heart care, we have created designated Provider Care Teams.  These Care Teams include your primary Cardiologist (physician) and Advanced Practice Providers (APPs -  Physician Assistants and Nurse Practitioners) who all work together to provide you with the care you need, when you need it.  We recommend signing up for the patient portal called "MyChart".  Sign up information is provided on this After Visit Summary.  MyChart is used to connect with patients for Virtual Visits (Telemedicine).  Patients are able to view lab/test results, encounter notes, upcoming appointments, etc.  Non-urgent messages can be sent to your provider as well.   To learn more about what you can do with MyChart, go to [email or web address].    Your next appointment:   6 month(s)  The format for your next appointment:   In Person  Provider:   Lyman Bishop MD  Other Instructions Monitor BP at home - send readings in 10-14 days

## 2021-07-23 NOTE — Progress Notes (Signed)
OFFICE CONSULT NOTE  Chief Complaint:  Follow-up palpitations  Primary Care Physician: Cyndi Bender, PA-C  HPI:  Susan Thornton is a 71 y.o. female who is being seen today for the evaluation of the above complaints at the request of Fae Pippin.  Susan Thornton is a pleasant 71 year old female was kindly referred for evaluation of chest pain, hypertension and tachycardia.  She reports an episode of acute blood pressure elevation with associated tachycardia.  She felt her heart racing which woke her from sleep and had discomfort in the left upper chest.  The pain did not radiate.  It was described as dull in nature.  The symptoms lasted for several minutes and she took Xanax which did not clearly improve her symptoms.  She denies any symptoms with exertion or improvement with rest.  She also has history of anxiety.  Family history is significant for heart disease in her father.  Recent labs show total cholesterol 189, HDL 59, LDL 91 and triglycerides 194.  Hemoglobin A1c 6.1.  06/16/2017  Susan Thornton returns today for follow-up of her stress test.  This was negative for ischemia and showed a normal LVEF of 55%.  She reports her chest discomfort has significantly improved if not almost totally gone away.  Blood pressure however remains elevated.  Today her blood pressure is 193/83.  At home her blood pressure has been elevated as well.  This could be contributing to her chest pain symptoms.  He is currently on medication including losartan 100 mg daily and metoprolol succinate 50 mg daily.  We discussed several options about how to better manage her blood pressure, but I suspect she is going to need another agent.  06/05/2021  Susan Thornton returns today for follow-up.  She is actually considered a new patient as she was last seen Jun 16, 2017, almost 4 years ago.  She was referred back because of palpitations and tachycardia.  She gets discomfort in her chest and neck when she has these  episodes.  She said on March 30 she had an episode where she was sitting down or lying in bed in the evening and developed tachycardia spontaneously.  Her Fitbit did capture this showing heart rate over 170 which was sustained for quite a while and eventually broke.  She has had several of these episodes a month and they have been more recent.  She ready is on 100 mg of Toprol XL daily.  She has had some benefit from taking Xanax for them.  Of note her blood pressure is elevated.  She was previously on amlodipine but reports she is not taking it.  Blood pressure was 153/78 today.  More recently at her PCPs office it was 160/90.  EKG today shows a normal sinus rhythm.  She does have a history of sleep apnea on CPAP and reports compliance.  07/23/2021  Susan Thornton is seen today in follow-up.  [birth year] birthday.  She wore a monitor for 30 days and was noted to have a 4-hour episode of atrial fibrillation on June 29, 2021 for which she was unaware of.  She notes that after adding verapamil and increasing the dose she has had better control of her palpitations and improvement in her blood pressure.  Initially systolic blood pressures were in the 180 range, but have improved now down into the 150s.  I noted today however she was on amlodipine.  I do not believe I was aware that she was on this medicine  when she was started on verapamil but ideally she should not be on both medicines.  She also takes losartan, hydrochlorothiazide and metoprolol.  With regards to atrial fibrillation, she is at increased risk for stroke with a CHA2DS2-VASc score of 4, I have recommended anticoagulation.  PMHx:  Past Medical History:  Diagnosis Date   Allergy    Anxiety    Cancer ([place])    pre cancerous cervical cells   Depression    GERD (gastroesophageal reflux disease)    History of colonic polyps 04/15/2010   Hyperlipidemia    Hypertension    Sleep apnea    Thyroid disease     Past Surgical History:  Procedure  Laterality Date   BLADDER SUSPENSION     BREAST LUMPECTOMY  1993   COLONOSCOPY     POLYPECTOMY     TONSILLECTOMY     VAGINAL HYSTERECTOMY  1993    FAMHx:  Family History  Problem Relation Age of Onset   Cancer Mother        sac in abdominal wall   Heart disease Father    Colon cancer Maternal Uncle 76   Colon polyps Neg Hx    Esophageal cancer Neg Hx    Stomach cancer Neg Hx    Rectal cancer Neg Hx     SOCHx:   reports that she quit smoking about 8 years ago. Her smoking use included cigarettes. She has a 40.00 pack-year smoking history. She has never used smokeless tobacco. She reports current alcohol use. She reports that she does not use drugs.  ALLERGIES:  Allergies  Allergen Reactions   Penicillins Hives    REACTION: hives    ROS: Pertinent items noted in HPI and remainder of comprehensive ROS otherwise negative.  HOME MEDS: Current Outpatient Medications on File Prior to Visit  Medication Sig Dispense Refill   ALPRAZolam (XANAX) 0.5 MG tablet Take 0.5 mg by mouth 2 (two) times daily as needed.      amLODipine (NORVASC) 5 MG tablet Take 1 tablet (5 mg total) by mouth daily. NEED OV. 30 tablet 0   atorvastatin (LIPITOR) 40 MG tablet Take 40 mg by mouth daily.     azelastine (ASTELIN) 0.1 % nasal spray INSTILL 1 2 SPRAYS PER NOSTRIL DAILY AS NEEDED FOR ALLERGIES     B Complex-C-Folic Acid (MULTIVITAMIN, STRESS FORMULA) tablet Take 1 tablet by mouth daily.       cetirizine (ZYRTEC) 10 MG tablet 1 tablet     cyclobenzaprine (FLEXERIL) 5 MG tablet Take 5 mg by mouth as needed for muscle spasms.     ESTRADIOL PO Take by mouth daily.     hydrochlorothiazide (HYDRODIURIL) 25 MG tablet Take 25 mg by mouth daily.     Levothyroxine Sodium 25 MCG CAPS Take by mouth daily before breakfast.     losartan (COZAAR) 100 MG tablet Take 100 mg by mouth daily.     meloxicam (MOBIC) 15 MG tablet Take 15 mg by mouth daily.     metoprolol succinate (TOPROL-XL) 100 MG 24 hr tablet Take  100 mg by mouth daily.     omeprazole (PRILOSEC) 40 MG capsule Take 40 mg by mouth as needed.     verapamil (CALAN-SR) 240 MG CR tablet Take 1 tablet (240 mg total) by mouth at bedtime. 90 tablet 3   zolpidem (AMBIEN) 10 MG tablet Take 10 mg by mouth at bedtime.     No current facility-administered medications on file prior to visit.  LABS/IMAGING: No results found for this or any previous visit (from the past 48 hour(s)). CARDIAC EVENT MONITOR  Result Date: 07/23/2021 Monitor detected a single episode of afib lasting about 4 hours on 6/11 at 2 pm. Pixie Casino, MD, [facility] [place] Director of the Advanced Lipid Disorders & Cardiovascular Risk Reduction Clinic Diplomate of the American Board of Clinical Lipidology Attending Cardiologist Direct Dial: [phone]  Fax: [phone] Website:  [email or web address]    LIPID PANEL: No results found for: "CHOL", "TRIG", "HDL", "CHOLHDL", "VLDL", "LDLCALC", "LDLDIRECT"  WEIGHTS: Wt Readings from Last 3 Encounters:  07/23/21 217 lb 9.6 oz (98.7 kg)  06/05/21 217 lb 3.2 oz (98.5 kg)  06/29/19 223 lb (101.2 kg)    VITALS: BP (!) 152/80   Pulse (!) 58   Ht 5' 4.5" (1.638 m)   Wt 217 lb 9.6 oz (98.7 kg)   SpO2 97%   BMI 36.77 kg/m   EXAM: Deferred  EKG: Deferred  ASSESSMENT: Atypical chest pain -low risk Myoview stress test, LVEF 55% (05/2017) Essential hypertension-uncontrolled PAF-CHA2DS2-VASc score 4 OSA on CPAP Anxiety  PLAN: 1.   Susan Thornton was noted to have a low burden of atrial fibrillation nonetheless it was present.  She has multiple risk factors for stroke and a CHA2DS2-VASc score 4.  I would advise starting anticoagulation with Eliquis 5 mg twice daily.  She reports improvement in her symptomatic palpitations on verapamil.  She cannot be on diet and amlodipine and I would advise stopping the amlodipine.  She may have an increase in blood pressure with this and if so I will consider  switching her metoprolol to carvedilol or some other option.  She reports compliance with CPAP.  Plan follow-up in 6 months or sooner as necessary  Pixie Casino, MD, [facility], [place] Director of the Advanced Lipid Disorders &  Cardiovascular Risk Reduction Clinic Diplomate of the American Board of Clinical Lipidology Attending Cardiologist  Direct Dial: [phone]  Fax: [phone]  Website:  [email or web address] 07/23/2021, 4:26 PM

## 2021-07-31 DIAGNOSIS — G4733 Obstructive sleep apnea (adult) (pediatric): Secondary | ICD-10-CM | POA: Diagnosis not present

## 2021-08-14 ENCOUNTER — Encounter: Payer: Self-pay | Admitting: Internal Medicine

## 2021-08-15 MED ORDER — CARVEDILOL 25 MG PO TABS: 25.0000 mg | ORAL_TABLET | Freq: Two times a day (BID) | ORAL | 3 refills | Status: DC

## 2021-09-10 ENCOUNTER — Encounter: Payer: Self-pay | Admitting: Internal Medicine

## 2021-09-11 NOTE — Telephone Encounter (Signed)
BP is too high - we had to stop amlodipine since she is on another CCB.  Would switch her Toprol XL to carvedilol 25 mg BID. This should help with BP.  Have her continue to monitor over the next few weeks.  Dr. Lemmie Evens

## 2021-09-12 ENCOUNTER — Encounter (HOSPITAL_BASED_OUTPATIENT_CLINIC_OR_DEPARTMENT_OTHER): Payer: Self-pay | Admitting: Internal Medicine

## 2021-10-06 DIAGNOSIS — N952 Postmenopausal atrophic vaginitis: Secondary | ICD-10-CM | POA: Diagnosis not present

## 2021-10-06 DIAGNOSIS — R29890 Loss of height: Secondary | ICD-10-CM | POA: Diagnosis not present

## 2021-10-06 DIAGNOSIS — Z124 Encounter for screening for malignant neoplasm of cervix: Secondary | ICD-10-CM | POA: Diagnosis not present

## 2021-10-06 DIAGNOSIS — N958 Other specified menopausal and perimenopausal disorders: Secondary | ICD-10-CM | POA: Diagnosis not present

## 2021-10-06 DIAGNOSIS — M13851 Other specified arthritis, right hip: Secondary | ICD-10-CM | POA: Diagnosis not present

## 2021-10-06 DIAGNOSIS — Z1231 Encounter for screening mammogram for malignant neoplasm of breast: Secondary | ICD-10-CM | POA: Diagnosis not present

## 2021-10-06 DIAGNOSIS — Z1272 Encounter for screening for malignant neoplasm of vagina: Secondary | ICD-10-CM | POA: Diagnosis not present

## 2021-10-06 DIAGNOSIS — E039 Hypothyroidism, unspecified: Secondary | ICD-10-CM | POA: Diagnosis not present

## 2021-10-06 DIAGNOSIS — Z6827 Body mass index (BMI) 27.0-27.9, adult: Secondary | ICD-10-CM | POA: Diagnosis not present

## 2021-10-06 DIAGNOSIS — Z1151 Encounter for screening for human papillomavirus (HPV): Secondary | ICD-10-CM | POA: Diagnosis not present

## 2021-10-08 ENCOUNTER — Other Ambulatory Visit: Payer: Self-pay | Admitting: Obstetrics and Gynecology

## 2021-10-08 DIAGNOSIS — R928 Other abnormal and inconclusive findings on diagnostic imaging of breast: Secondary | ICD-10-CM

## 2021-10-17 ENCOUNTER — Ambulatory Visit
Admission: RE | Admit: 2021-10-17 | Discharge: 2021-10-17 | Disposition: A | Discharge status: Home / Self Care | Payer: PPO | Source: Ambulatory Visit | Attending: Obstetrics and Gynecology | Admitting: Obstetrics and Gynecology

## 2021-10-17 DIAGNOSIS — R928 Other abnormal and inconclusive findings on diagnostic imaging of breast: Secondary | ICD-10-CM

## 2021-10-17 DIAGNOSIS — N6002 Solitary cyst of left breast: Secondary | ICD-10-CM | POA: Diagnosis not present

## 2021-10-23 ENCOUNTER — Encounter (HOSPITAL_BASED_OUTPATIENT_CLINIC_OR_DEPARTMENT_OTHER): Payer: Self-pay | Admitting: Internal Medicine

## 2021-10-30 MED ORDER — CARVEDILOL 25 MG PO TABS: 12.5000 mg | ORAL_TABLET | Freq: Two times a day (BID) | ORAL | 3 refills | Status: DC

## 2021-10-31 DIAGNOSIS — G4733 Obstructive sleep apnea (adult) (pediatric): Secondary | ICD-10-CM | POA: Diagnosis not present

## 2021-11-10 DIAGNOSIS — K219 Gastro-esophageal reflux disease without esophagitis: Secondary | ICD-10-CM | POA: Diagnosis not present

## 2021-11-10 DIAGNOSIS — E7800 Pure hypercholesterolemia, unspecified: Secondary | ICD-10-CM | POA: Diagnosis not present

## 2021-11-10 DIAGNOSIS — R7303 Prediabetes: Secondary | ICD-10-CM | POA: Diagnosis not present

## 2021-11-10 DIAGNOSIS — F419 Anxiety disorder, unspecified: Secondary | ICD-10-CM | POA: Diagnosis not present

## 2021-11-10 DIAGNOSIS — I4891 Unspecified atrial fibrillation: Secondary | ICD-10-CM | POA: Diagnosis not present

## 2021-11-10 DIAGNOSIS — Z23 Encounter for immunization: Secondary | ICD-10-CM | POA: Diagnosis not present

## 2021-11-10 DIAGNOSIS — I10 Essential (primary) hypertension: Secondary | ICD-10-CM | POA: Diagnosis not present

## 2021-11-10 DIAGNOSIS — Z79899 Other long term (current) drug therapy: Secondary | ICD-10-CM | POA: Diagnosis not present

## 2021-11-10 DIAGNOSIS — G4733 Obstructive sleep apnea (adult) (pediatric): Secondary | ICD-10-CM | POA: Diagnosis not present

## 2021-11-10 DIAGNOSIS — N390 Urinary tract infection, site not specified: Secondary | ICD-10-CM | POA: Diagnosis not present

## 2021-11-10 DIAGNOSIS — E039 Hypothyroidism, unspecified: Secondary | ICD-10-CM | POA: Diagnosis not present

## 2021-11-10 DIAGNOSIS — G4700 Insomnia, unspecified: Secondary | ICD-10-CM | POA: Diagnosis not present

## 2022-02-03 DIAGNOSIS — G4733 Obstructive sleep apnea (adult) (pediatric): Secondary | ICD-10-CM | POA: Diagnosis not present

## 2022-03-12 ENCOUNTER — Encounter (HOSPITAL_BASED_OUTPATIENT_CLINIC_OR_DEPARTMENT_OTHER): Payer: Self-pay | Admitting: Internal Medicine

## 2022-03-19 ENCOUNTER — Ambulatory Visit: Payer: HMO | Attending: Internal Medicine | Admitting: Internal Medicine

## 2022-03-19 ENCOUNTER — Encounter: Payer: Self-pay | Admitting: Internal Medicine

## 2022-03-19 VITALS — BP 168/80 | HR 62 | Ht 64.0 in | Wt 222.8 lb

## 2022-03-19 DIAGNOSIS — R5383 Other fatigue: Secondary | ICD-10-CM

## 2022-03-19 DIAGNOSIS — I480 Paroxysmal atrial fibrillation: Secondary | ICD-10-CM

## 2022-03-19 DIAGNOSIS — G4733 Obstructive sleep apnea (adult) (pediatric): Secondary | ICD-10-CM | POA: Diagnosis not present

## 2022-03-19 DIAGNOSIS — I10 Essential (primary) hypertension: Secondary | ICD-10-CM

## 2022-03-19 MED ORDER — CARVEDILOL 3.125 MG PO TABS: 3.1250 mg | ORAL_TABLET | Freq: Two times a day (BID) | ORAL | 1 refills | Status: DC

## 2022-03-19 NOTE — Progress Notes (Addendum)
OFFICE CONSULT NOTE  Chief Complaint:  Follow-up  Primary Care Physician: Cyndi Bender, PA-C  HPI:  Susan Thornton is a 72 y.o. female who is being seen today for the evaluation of the above complaints at the request of Fae Pippin.  Susan Thornton is a pleasant 72 year old female was kindly referred for evaluation of chest pain, hypertension and tachycardia.  She reports an episode of acute blood pressure elevation with associated tachycardia.  She felt her heart racing which woke her from sleep and had discomfort in the left upper chest.  The pain did not radiate.  It was described as dull in nature.  The symptoms lasted for several minutes and she took Xanax which did not clearly improve her symptoms.  She denies any symptoms with exertion or improvement with rest.  She also has history of anxiety.  Family history is significant for heart disease in her father.  Recent labs show total cholesterol 189, HDL 59, LDL 91 and triglycerides 194.  Hemoglobin A1c 6.1.  06/16/2017  Susan Thornton returns today for follow-up of her stress test.  This was negative for ischemia and showed a normal LVEF of 55%.  She reports her chest discomfort has significantly improved if not almost totally gone away.  Blood pressure however remains elevated.  Today her blood pressure is 193/83.  At home her blood pressure has been elevated as well.  This could be contributing to her chest pain symptoms.  He is currently on medication including losartan 100 mg daily and metoprolol succinate 50 mg daily.  We discussed several options about how to better manage her blood pressure, but I suspect she is going to need another agent.  06/05/2021  Susan Thornton returns today for follow-up.  She is actually considered a new patient as she was last seen Jun 16, 2017, almost 4 years ago.  She was referred back because of palpitations and tachycardia.  She gets discomfort in her chest and neck when she has these episodes.  She  said on March 30 she had an episode where she was sitting down or lying in bed in the evening and developed tachycardia spontaneously.  Her Fitbit did capture this showing heart rate over 170 which was sustained for quite a while and eventually broke.  She has had several of these episodes a month and they have been more recent.  She ready is on 100 mg of Toprol XL daily.  She has had some benefit from taking Xanax for them.  Of note her blood pressure is elevated.  She was previously on amlodipine but reports she is not taking it.  Blood pressure was 153/78 today.  More recently at her PCPs office it was 160/90.  EKG today shows a normal sinus rhythm.  She does have a history of sleep apnea on CPAP and reports compliance.  07/23/2021  Susan Thornton is seen today in follow-up.  [birth year] birthday.  She wore a monitor for 30 days and was noted to have a 4-hour episode of atrial fibrillation on June 29, 2021 for which she was unaware of.  She notes that after adding verapamil and increasing the dose she has had better control of her palpitations and improvement in her blood pressure.  Initially systolic blood pressures were in the 180 range, but have improved now down into the 150s.  I noted today however she was on amlodipine.  I do not believe I was aware that she was on this medicine when  she was started on verapamil but ideally she should not be on both medicines.  She also takes losartan, hydrochlorothiazide and metoprolol.  With regards to atrial fibrillation, she is at increased risk for stroke with a CHA2DS2-VASc score of 4, I have recommended anticoagulation.  03/19/2022  Susan Thornton is seen today in follow-up.  She has had some progressive fatigue after switching her to carvedilol.  I decreased the dose from 25 twice daily down to 12.5 twice daily but she reports she is still very fatigued.  She is compliant with her CPAP.  Blood pressures in the office continues to be high.  She has not consistently  monitored home blood pressures with a blood pressure cuff although has a watch that apparently can do blood pressures however it seems like those are not accurate.  PMHx:  Past Medical History:  Diagnosis Date   Allergy    Anxiety    Cancer ([place])    pre cancerous cervical cells   Depression    GERD (gastroesophageal reflux disease)    History of colonic polyps 04/15/2010   Hyperlipidemia    Hypertension    Sleep apnea    Thyroid disease     Past Surgical History:  Procedure Laterality Date   BLADDER SUSPENSION     BREAST EXCISIONAL BIOPSY Left 1993   COLONOSCOPY     POLYPECTOMY     TONSILLECTOMY     VAGINAL HYSTERECTOMY  1993    FAMHx:  Family History  Problem Relation Age of Onset   Cancer Mother        sac in abdominal wall   Heart disease Father    Colon cancer Maternal Uncle 82   Breast cancer Maternal Grandmother    Colon polyps Neg Hx    Esophageal cancer Neg Hx    Stomach cancer Neg Hx    Rectal cancer Neg Hx     SOCHx:   reports that she quit smoking about 9 years ago. Her smoking use included cigarettes. She has a 40.00 pack-year smoking history. She has never used smokeless tobacco. She reports current alcohol use. She reports that she does not use drugs.  ALLERGIES:  Allergies  Allergen Reactions   Penicillins Hives    REACTION: hives    ROS: Pertinent items noted in HPI and remainder of comprehensive ROS otherwise negative.  HOME MEDS: Current Outpatient Medications on File Prior to Visit  Medication Sig Dispense Refill   ALPRAZolam (XANAX) 0.5 MG tablet Take 0.5 mg by mouth 2 (two) times daily as needed.      apixaban (ELIQUIS) 5 MG TABS tablet Take 1 tablet (5 mg total) by mouth 2 (two) times daily. 60 tablet 11   atorvastatin (LIPITOR) 40 MG tablet Take 40 mg by mouth daily.     azelastine (ASTELIN) 0.1 % nasal spray INSTILL 1 2 SPRAYS PER NOSTRIL DAILY AS NEEDED FOR ALLERGIES     B Complex-C-Folic Acid (MULTIVITAMIN, STRESS FORMULA) tablet  Take 1 tablet by mouth daily.       carvedilol (COREG) 25 MG tablet Take 0.5 tablets (12.5 mg total) by mouth 2 (two) times daily with a meal. 90 tablet 3   cetirizine (ZYRTEC) 10 MG tablet 1 tablet     cyclobenzaprine (FLEXERIL) 5 MG tablet Take 5 mg by mouth as needed for muscle spasms.     ESTRADIOL PO Take by mouth daily.     hydrochlorothiazide (HYDRODIURIL) 25 MG tablet Take 25 mg by mouth daily.     Levothyroxine  Sodium 25 MCG CAPS Take by mouth daily before breakfast.     losartan (COZAAR) 100 MG tablet Take 100 mg by mouth daily.     meloxicam (MOBIC) 15 MG tablet Take 15 mg by mouth daily.     omeprazole (PRILOSEC) 40 MG capsule Take 40 mg by mouth as needed.     verapamil (CALAN-SR) 240 MG CR tablet Take 1 tablet (240 mg total) by mouth at bedtime. 90 tablet 3   zolpidem (AMBIEN) 10 MG tablet Take 10 mg by mouth at bedtime.     No current facility-administered medications on file prior to visit.    LABS/IMAGING: No results found for this or any previous visit (from the past 48 hour(s)). No results found.  LIPID PANEL: No results found for: "CHOL", "TRIG", "HDL", "CHOLHDL", "VLDL", "LDLCALC", "LDLDIRECT"  WEIGHTS: Wt Readings from Last 3 Encounters:  03/19/22 222 lb 12.8 oz (101.1 kg)  07/23/21 217 lb 9.6 oz (98.7 kg)  06/05/21 217 lb 3.2 oz (98.5 kg)    VITALS: BP (!) 168/80 (BP Location: Left Arm, Patient Position: Sitting, Cuff Size: Normal)   Pulse 62   Ht '5\' 4"'$  (1.626 m)   Wt 222 lb 12.8 oz (101.1 kg)   SpO2 96%   BMI 38.24 kg/m   EXAM: Deferred  EKG: Normal sinus rhythm at 62-personally reviewed  ASSESSMENT: Atypical chest pain -low risk Myoview stress test, LVEF 55% (05/2017) Essential hypertension-uncontrolled PAF-CHA2DS2-VASc score 4 OSA on CPAP Anxiety  PLAN: 1.   Ms. Brezina continues to have significant fatigue in the morning about 1 to 2 hours after taking her carvedilol.  I suspect this is related.  Will decrease the dose from 12-1/2 twice a  day down to 3.125 mg twice a day.  As expected blood pressure will likely increase.  I advised her to take blood pressure readings at least twice a day for the next 1 to 2 weeks and report them to [place] so that we can determine what other medication changes need to be made to adequately control her blood pressure.  She does appear to be maintaining sinus rhythm.  She has no bleeding issues on anticoagulation but did inquire about the Watchman procedure today.  I do not think it is favorable from a risk benefit standpoint for her to consider that since she has done well on anticoagulation.  Follow-up in 3 to 4 months.  Pixie Casino, MD, [facility], [place] Director of the [facility]  [facility] Diplomate of the American Board of Clinical Lipidology Attending Cardiologist  Direct Dial: [phone]  Fax: [phone]  Website:  [email or web address] 03/19/2022, 8:39 AM

## 2022-03-19 NOTE — Patient Instructions (Addendum)
Medication Instructions:  DECREASE carvedilol to 3.'125mg'$  twice daily  *If you need a refill on your cardiac medications before your next appointment, please call your pharmacy*     Follow-Up: At [facility], you and your health needs are our priority.  As part of our continuing mission to provide you with exceptional heart care, we have created designated Provider Care Teams.  These Care Teams include your primary Cardiologist (physician) and Advanced Practice Providers (APPs -  Physician Assistants and Nurse Practitioners) who all work together to provide you with the care you need, when you need it.  We recommend signing up for the patient portal called "MyChart".  Sign up information is provided on this After Visit Summary.  MyChart is used to connect with patients for Virtual Visits (Telemedicine).  Patients are able to view lab/test results, encounter notes, upcoming appointments, etc.  Non-urgent messages can be sent to your provider as well.   To learn more about what you can do with MyChart, go to [email or web address].     3 months with Dr. Debara Pickett or PA/NP for blood pressure follow up    Please check BP at home twice daily for about 2 weeks. You can call with the readings or send in [place]: Rest 5 minutes before taking your blood pressure. Don't smoke or drink caffeinated beverages for at least 30 minutes before. Take your blood pressure before (not after) you eat. Sit comfortably with your back supported and both feet on the floor (don't cross your legs). Elevate your arm to heart level on a table or a desk. Use the proper sized cuff. It should fit smoothly and snugly around your bare upper arm. There should be enough room to slip a fingertip under the cuff. The bottom edge of the cuff should be 1 inch above the crease of the elbow. Ideally, take 3 measurements at one sitting and record the average.

## 2022-03-26 DIAGNOSIS — K219 Gastro-esophageal reflux disease without esophagitis: Secondary | ICD-10-CM | POA: Diagnosis not present

## 2022-03-26 DIAGNOSIS — Z1331 Encounter for screening for depression: Secondary | ICD-10-CM | POA: Diagnosis not present

## 2022-03-26 DIAGNOSIS — F419 Anxiety disorder, unspecified: Secondary | ICD-10-CM | POA: Diagnosis not present

## 2022-03-26 DIAGNOSIS — G4700 Insomnia, unspecified: Secondary | ICD-10-CM | POA: Diagnosis not present

## 2022-03-26 DIAGNOSIS — E7800 Pure hypercholesterolemia, unspecified: Secondary | ICD-10-CM | POA: Diagnosis not present

## 2022-03-26 DIAGNOSIS — G4733 Obstructive sleep apnea (adult) (pediatric): Secondary | ICD-10-CM | POA: Diagnosis not present

## 2022-03-26 DIAGNOSIS — I4891 Unspecified atrial fibrillation: Secondary | ICD-10-CM | POA: Diagnosis not present

## 2022-03-26 DIAGNOSIS — Z79899 Other long term (current) drug therapy: Secondary | ICD-10-CM | POA: Diagnosis not present

## 2022-03-26 DIAGNOSIS — R7303 Prediabetes: Secondary | ICD-10-CM | POA: Diagnosis not present

## 2022-03-26 DIAGNOSIS — E039 Hypothyroidism, unspecified: Secondary | ICD-10-CM | POA: Diagnosis not present

## 2022-03-26 DIAGNOSIS — Z139 Encounter for screening, unspecified: Secondary | ICD-10-CM | POA: Diagnosis not present

## 2022-03-26 DIAGNOSIS — I10 Essential (primary) hypertension: Secondary | ICD-10-CM | POA: Diagnosis not present

## 2022-03-30 ENCOUNTER — Encounter: Payer: Self-pay | Admitting: Internal Medicine

## 2022-04-11 ENCOUNTER — Other Ambulatory Visit: Payer: Self-pay | Admitting: Internal Medicine

## 2022-04-13 ENCOUNTER — Encounter: Payer: Self-pay | Admitting: Internal Medicine

## 2022-04-13 MED ORDER — CARVEDILOL 25 MG PO TABS: 12.5000 mg | ORAL_TABLET | Freq: Two times a day (BID) | ORAL | 1 refills | Status: DC

## 2022-04-14 ENCOUNTER — Other Ambulatory Visit: Payer: Self-pay | Admitting: Internal Medicine

## 2022-05-06 DIAGNOSIS — G4733 Obstructive sleep apnea (adult) (pediatric): Secondary | ICD-10-CM | POA: Diagnosis not present

## 2022-06-14 NOTE — Progress Notes (Unsigned)
Cardiology Clinic Note   Date: 06/17/2022 ID: TASHAUNA RUMAN, DOB 10-05-1950, MRN [id number]  Primary Cardiologist:  None  Patient Profile    Susan Thornton is a 72 y.o. female who presents to the clinic today for follow up.   Past medical history significant for: PAF. 30-day cardiac telemetry monitor 07/23/2021: Single episode of A-fib lasting 4 hours on 06/29/2021. Hypertension. OSA.   History of Present Illness    Susan Thornton was evaluated by Dr. Rennis Golden on 05/18/2017 for hypertension, chest pain, tachycardia at the request of Susan Peak, PA-C.  She reported an episode of blood pressure elevation and associated tachycardia in which she woke from sleep with her heart racing and discomfort in the left upper chest described as a dull and nonradiating.  Episode lasted several minutes and she took a Xanax without clear improvement of symptoms.  She denied any exertional symptoms or improvement with rest.  She underwent nuclear stress testing which showed a small area of apical ischemia not likely significant due to marked extracardiac activity.  Normal EF.  Patient was not seen again until 06/05/2021 for evaluation of palpitations.  She wore a 30-day monitor which showed an episode of A-fib lasting 4 hours.  She was started on anticoagulation.  Patient was last seen in the office by Dr. Rennis Golden on 03/19/2022 for follow-up.  She reported fatigue on carvedilol despite decreased dose.  Carvedilol was decreased further and she was advised to monitor BP at least twice a day over the next couple of weeks.  Today, patient reports continued fatigue. Her BP is poorly controlled varying from 130-180/50-80 with no pattern to the high readings. Patient reports mild headache and lightheadedness when BP is high. She also reports episodes of elevated heart rate sometimes related to anxiety. On Sunday she had a run of elevated heart rate to 170 bpm for 15-20 minutes per her Smart watch. Patient does have a  lot of anxiety. She takes xanax as needed but her PCP does not want her to continue on it. She is concerned as she feels it does help bring down her heart rate when it is racing. She was started on Lexapro about 3-4 months ago and feels like it may be helping a little. I suggested she contact the provider managing this to see if it can be increased. Of note her last HGB was 8.5 on 06/04/2022. She states nobody has ever told her it was low or that she has anemia. She denies blood in stool or urine or other bleeding concerns.    ROS: All other systems reviewed and are otherwise negative except as noted in History of Present Illness.  Studies Reviewed    ECG is not ordered today.   Risk Assessment/Calculations     CHA2DS2-VASc Score = 3   This indicates a 3.2% annual risk of stroke. The patient's score is based upon: CHF History: 0 HTN History: 1 Diabetes History: 0 Stroke History: 0 Vascular Disease History: 0 Age Score: 1 Gender Score: 1             Physical Exam    VS:  BP 112/60 (BP Location: Left Arm, Patient Position: Sitting, Cuff Size: Normal)   Ht 5\' 4"  (1.626 m)   Wt 214 lb (97.1 kg)   SpO2 95%   BMI 36.73 kg/m  , BMI Body mass index is 36.73 kg/m.  GEN: Well nourished, well developed, in no acute distress. Neck: No JVD or carotid bruits. Cardiac:  RRR.  No murmurs. No rubs or gallops.   Respiratory:  Respirations regular and unlabored. Clear to auscultation without rales, wheezing or rhonchi. GI: Soft, nontender, nondistended. Extremities: Radials/DP/PT 2+ and equal bilaterally. No clubbing or cyanosis. No edema.  Skin: Warm and dry, no rash. Neuro: Strength intact.  Assessment & Plan    Fatigue. Patient reports continued fatigue. She was tried on Carvedilol 3.125 mg bid but she was having more breakthrough palpitations so she went back up to 12.5 mg bid. She states she constantly feels "draggy." Just getting ready to come in today makes her feel exhausted.  Will stop carvedilol and start Toprol XL 25 mg daily. Will change her antihypertensive medication to cover her BP (see #3). Her last HGB per [facility] was 8.5 on 06/04/2022. Patient reports she was never told she had low hemoglobin or anemia. Will get a CBC today. Her thyroid was normal in August 2023 so I will not repeat that today.  PAF.  30-day cardiac monitor July 2023 showed single episode of A-fib lasting 4 hours.  Patient reports episodes of elevated heart rate that vary in frequency and length of time. A couple of days ago her heart rate increased to 170 bpm for 15-20 minutes per her smart watch. Elevated heart rate is sometimes related to anxiety. RRR on exam today. Denies spontaneous bleeding concerns.  Will stop carvedilol and start Toprol XL 25 mg (see #1). She is instructed to take extra 1/2 of Metoprolol if she has episodes of sustained heart rate >100 bpm.  Hypertension. BP today 112/60.  Home readings 130-150/50-80. There is no pattern to readings. She reports headaches and lightheadedness with high readings. No vision changes.Will stop carvedilol and Losartan. Start Toprol 25 mg daily, Olmesartan 20 mg daily. Continue HCTZ and verapamil.    Disposition: CBC today. Stop losartan and carvedilol. Start Toprol 25 mg daily, olmesartan 20 mg daily.  Continue checking BP. Return in 4 weeks or sooner as needed.          Signed, Etta Grandchild. Khadejah Son, DNP, NP-C

## 2022-06-17 ENCOUNTER — Ambulatory Visit: Payer: HMO | Attending: Student | Admitting: Student

## 2022-06-17 ENCOUNTER — Encounter: Payer: Self-pay | Admitting: Student

## 2022-06-17 VITALS — BP 112/60 | Ht 64.0 in | Wt 214.0 lb

## 2022-06-17 DIAGNOSIS — R5383 Other fatigue: Secondary | ICD-10-CM | POA: Diagnosis not present

## 2022-06-17 DIAGNOSIS — I10 Essential (primary) hypertension: Secondary | ICD-10-CM

## 2022-06-17 DIAGNOSIS — I480 Paroxysmal atrial fibrillation: Secondary | ICD-10-CM | POA: Diagnosis not present

## 2022-06-17 MED ORDER — OLMESARTAN MEDOXOMIL 20 MG PO TABS: 20.0000 mg | ORAL_TABLET | Freq: Every day | ORAL | 3 refills | Status: DC

## 2022-06-17 MED ORDER — METOPROLOL SUCCINATE ER 25 MG PO TB24: 25.0000 mg | ORAL_TABLET | Freq: Every day | ORAL | 3 refills | Status: DC

## 2022-06-17 NOTE — Patient Instructions (Addendum)
Medication Instructions:  Your physician has recommended you make the following change in your medication:  STOP: Losartan and Carvedilol  START: Olmesartan 20mg  daily  START: Toprol XL 25mg  daily *If you need a refill on your cardiac medications before your next appointment, please call your pharmacy*   Lab Work: Your physician recommends that you have the following labs drawn today: CBC If you have labs (blood work) drawn today and your tests are completely normal, you will receive your results only by: MyChart Message (if you have MyChart) OR A paper copy in the mail If you have any lab test that is abnormal or we need to change your treatment, we will call you to review the results.   Testing/Procedures: NONE   Follow-Up: At [facility], you and your health needs are our priority.  As part of our continuing mission to provide you with exceptional heart care, we have created designated Provider Care Teams.  These Care Teams include your primary Cardiologist (physician) and Advanced Practice Providers (APPs -  Physician Assistants and Nurse Practitioners) who all work together to provide you with the care you need, when you need it.  We recommend signing up for the patient portal called "MyChart".  Sign up information is provided on this After Visit Summary.  MyChart is used to connect with patients for Virtual Visits (Telemedicine).  Patients are able to view lab/test results, encounter notes, upcoming appointments, etc.  Non-urgent messages can be sent to your provider as well.   To learn more about what you can do with MyChart, go to [email or web address].    Your next appointment:   4 week(s)  Provider:   Carlos Levering, NP

## 2022-06-18 LAB — CBC
Hematocrit: 34.4 % (ref 34.0–46.6)
Hemoglobin: 11.2 g/dL (ref 11.1–15.9)
MCH: 27.9 pg (ref 26.6–33.0)
MCHC: 32.6 g/dL (ref 31.5–35.7)
MCV: 86 fL (ref 79–97)
Platelets: 211 10*3/uL (ref 150–450)
RBC: 4.01 x10E6/uL (ref 3.77–5.28)
RDW: 13.5 % (ref 11.7–15.4)
WBC: 6.2 10*3/uL (ref 3.4–10.8)

## 2022-07-16 ENCOUNTER — Other Ambulatory Visit: Payer: Self-pay | Admitting: Internal Medicine

## 2022-07-16 DIAGNOSIS — I480 Paroxysmal atrial fibrillation: Secondary | ICD-10-CM

## 2022-07-16 NOTE — Telephone Encounter (Signed)
Eliquis 5mg  refill request received. Patient is 73 years old, weight-97.1kg, Crea-1.07 on 03/27/22 via [employer], [place], and last seen by [employer] on 06/17/22. Dose is appropriate based on dosing criteria. Will send in refill to requested pharmacy.

## 2022-07-21 ENCOUNTER — Encounter: Payer: Self-pay | Admitting: Physician Assistant

## 2022-07-21 ENCOUNTER — Ambulatory Visit: Payer: HMO | Admitting: Student

## 2022-07-21 ENCOUNTER — Ambulatory Visit: Payer: HMO | Attending: Student | Admitting: Physician Assistant

## 2022-07-21 VITALS — BP 130/90 | HR 69 | Ht 64.0 in | Wt 219.2 lb

## 2022-07-21 DIAGNOSIS — I480 Paroxysmal atrial fibrillation: Secondary | ICD-10-CM | POA: Diagnosis not present

## 2022-07-21 DIAGNOSIS — I10 Essential (primary) hypertension: Secondary | ICD-10-CM

## 2022-07-21 NOTE — Patient Instructions (Addendum)
Medication Instructions:  No changes   *If you need a refill on your cardiac medications before your next appointment, please call your pharmacy*   Lab Work: Not needed    Testing/Procedures:  Not needed  Follow-Up: At [facility], you and your health needs are our priority.  As part of our continuing mission to provide you with exceptional heart care, we have created designated Provider Care Teams.  These Care Teams include your primary Cardiologist (physician) and Advanced Practice Providers (APPs -  Physician Assistants and Nurse Practitioners) who all work together to provide you with the care you need, when you need it.     Your next appointment:    5 to 6 month(s)  The format for your next appointment:   In Person  Provider:   Chrystie Nose, MD

## 2022-07-21 NOTE — Progress Notes (Unsigned)
  Cardiology Office Note:  .   Date:  07/23/2022  ID:  Susan Thornton, DOB 07/14/1950, MRN [id number] PCP: [name], PA-C  [facility] Providers Cardiologist:  Chrystie Nose, MD     History of Present Illness: .   Susan Thornton is a 72 y.o. female with past medical history of PAF, hypertension and OSA.  Myoview obtained on 05/25/2017 showed EF 55%, small area of apical ischemia likely nonsignificant due to marked extracardiac activity, overall low risk study.  Heart monitor obtained in July 2023 showed a single episode of A-fib lasting for hours.  She was started on anticoagulation therapy at that time.  She was seen by Dr. Rennis Golden in February 2024 at which time she reported fatigue despite the decreased dose of carvedilol.  Coreg was decreased further.  She was seen by Lestine Mount NP, blood pressure was uncontrolled.  She was having mild headache and lightheadedness when the blood pressure was high.  Her carvedilol was switched to metoprolol succinate and losartan was switched to olmesartan 20 mg daily.  Blood work showed normal hemoglobin at 11.2.  She presents today for follow-up.  Blood pressure is much better controlled after the last blood pressure medication adjustment.  She denies any chest pain or shortness of breath.  She has no lower extremity edema, orthopnea or PND.  She can follow-up in 5 to 6 months with Dr. Rennis Golden.  ROS:   She denies chest pain, palpitations, dyspnea, pnd, orthopnea, n, v, dizziness, syncope, edema, weight gain, or early satiety. All other systems reviewed and are otherwise negative except as noted above.    Studies Reviewed: Marland Kitchen   EKG Interpretation Date/Time:  Tuesday July 21 2022 11:16:48 EDT Ventricular Rate:  69 PR Interval:  146 QRS Duration:  84 QT Interval:  392 QTC Calculation: 420 R Axis:   81  Text Interpretation: Normal sinus rhythm Normal ECG When compared with ECG of 16-Jul-2009 06:22, No significant change was found Confirmed  by Azalee Course [phone]) on 07/22/2022 12:51:19 PM     Risk Assessment/Calculations:            Physical Exam:   VS:  BP (!) 130/90   Pulse 69   Ht 5\' 4"  (1.626 m)   Wt 219 lb 3.2 oz (99.4 kg)   SpO2 95%   BMI 37.63 kg/m    Wt Readings from Last 3 Encounters:  07/21/22 219 lb 3.2 oz (99.4 kg)  06/17/22 214 lb (97.1 kg)  03/19/22 222 lb 12.8 oz (101.1 kg)    GEN: Well nourished, well developed in no acute distress NECK: No JVD; No carotid bruits CARDIAC: RRR, no murmurs, rubs, gallops RESPIRATORY:  Clear to auscultation without rales, wheezing or rhonchi  ABDOMEN: Soft, non-tender, non-distended EXTREMITIES:  No edema; No deformity   ASSESSMENT AND PLAN: .    PAF: On Eliquis and metoprolol succinate.  Hypertension: Blood pressure better controlled on current therapy.  Medication was recently adjusted.       Dispo: Follow-up with Dr. Rennis Golden in 5 to 6 months.  Signed, Azalee Course, PA

## 2022-08-14 DIAGNOSIS — G4733 Obstructive sleep apnea (adult) (pediatric): Secondary | ICD-10-CM | POA: Diagnosis not present

## 2022-09-17 ENCOUNTER — Other Ambulatory Visit: Payer: Self-pay | Admitting: Internal Medicine

## 2022-10-28 ENCOUNTER — Encounter: Payer: Self-pay | Admitting: Internal Medicine

## 2022-11-02 DIAGNOSIS — R7303 Prediabetes: Secondary | ICD-10-CM | POA: Diagnosis not present

## 2022-11-02 DIAGNOSIS — E7800 Pure hypercholesterolemia, unspecified: Secondary | ICD-10-CM | POA: Diagnosis not present

## 2022-11-02 DIAGNOSIS — I10 Essential (primary) hypertension: Secondary | ICD-10-CM | POA: Diagnosis not present

## 2022-11-02 DIAGNOSIS — G4700 Insomnia, unspecified: Secondary | ICD-10-CM | POA: Diagnosis not present

## 2022-11-02 DIAGNOSIS — E039 Hypothyroidism, unspecified: Secondary | ICD-10-CM | POA: Diagnosis not present

## 2022-11-02 DIAGNOSIS — G4733 Obstructive sleep apnea (adult) (pediatric): Secondary | ICD-10-CM | POA: Diagnosis not present

## 2022-11-02 DIAGNOSIS — Z23 Encounter for immunization: Secondary | ICD-10-CM | POA: Diagnosis not present

## 2022-11-02 DIAGNOSIS — K219 Gastro-esophageal reflux disease without esophagitis: Secondary | ICD-10-CM | POA: Diagnosis not present

## 2022-11-02 DIAGNOSIS — I4891 Unspecified atrial fibrillation: Secondary | ICD-10-CM | POA: Diagnosis not present

## 2022-11-02 DIAGNOSIS — F419 Anxiety disorder, unspecified: Secondary | ICD-10-CM | POA: Diagnosis not present

## 2022-11-12 DIAGNOSIS — Z6837 Body mass index (BMI) 37.0-37.9, adult: Secondary | ICD-10-CM | POA: Diagnosis not present

## 2022-11-12 DIAGNOSIS — F411 Generalized anxiety disorder: Secondary | ICD-10-CM | POA: Diagnosis not present

## 2022-11-12 DIAGNOSIS — Z1231 Encounter for screening mammogram for malignant neoplasm of breast: Secondary | ICD-10-CM | POA: Diagnosis not present

## 2022-11-12 DIAGNOSIS — Z01419 Encounter for gynecological examination (general) (routine) without abnormal findings: Secondary | ICD-10-CM | POA: Diagnosis not present

## 2022-11-15 DIAGNOSIS — G4733 Obstructive sleep apnea (adult) (pediatric): Secondary | ICD-10-CM | POA: Diagnosis not present

## 2022-12-21 ENCOUNTER — Ambulatory Visit: Payer: HMO | Admitting: Internal Medicine

## 2023-01-21 ENCOUNTER — Encounter: Payer: Self-pay | Admitting: Physician Assistant

## 2023-01-21 ENCOUNTER — Ambulatory Visit: Payer: HMO | Attending: Physician Assistant | Admitting: Physician Assistant

## 2023-01-21 VITALS — BP 120/82 | HR 97 | Ht 64.5 in | Wt 221.0 lb

## 2023-01-21 DIAGNOSIS — I10 Essential (primary) hypertension: Secondary | ICD-10-CM | POA: Diagnosis not present

## 2023-01-21 DIAGNOSIS — R0789 Other chest pain: Secondary | ICD-10-CM | POA: Diagnosis not present

## 2023-01-21 DIAGNOSIS — I480 Paroxysmal atrial fibrillation: Secondary | ICD-10-CM

## 2023-01-21 MED ORDER — METOPROLOL SUCCINATE ER 50 MG PO TB24: 50.0000 mg | ORAL_TABLET | Freq: Every day | ORAL | 3 refills | Status: DC

## 2023-01-21 NOTE — Patient Instructions (Signed)
 Medication Instructions:  Your physician has recommended you make the following change in your medication:  STOP: Losartan  INCREASE: Metoprolol  succinate (Toprol -XL) 50 mg once daily  *If you need a refill on your cardiac medications before your next appointment, please call your pharmacy*   Lab Work: None   Testing/Procedures:    Please report to Radiology at the [facility] Main Entrance 30 minutes early for your test.  [address] [place], [place] [address]  How to Prepare for Your Cardiac PET/CT Stress Test:  Nothing to eat or drink, except water, 3 hours prior to arrival time.  NO caffeine /decaffeinated products, or chocolate 12 hours prior to arrival. (Please note decaffeinated beverages (teas/coffees) still contain caffeine ).  If you have caffeine  within 12 hours prior, the test will need to be rescheduled.  Medication instructions: Do not take erectile dysfunction medications for 72 hours prior to test (sildenafil, tadalafil) Do not take nitrates (isosorbide mononitrate, Ranexa) the day before or day of test Do not take tamsulosin the day before or morning of test Hold theophylline containing medications for 12 hours. Hold Dipyridamole 48 hours prior to the test.  Diabetic Preparation: If able to eat breakfast prior to 3 hour fasting, you may take all medications, including your insulin. Do not worry if you miss your breakfast dose of insulin - start at your next meal. If you do not eat prior to 3 hour fast-Hold all diabetes (oral and insulin) medications. Patients who wear a continuous glucose monitor MUST remove the device prior to scanning.  You may take your remaining medications with water.  NO perfume, cologne or lotion on chest or abdomen area. FEMALES - Please avoid wearing dresses to this appointment.  Total time is 1 to 2 hours; you may want to bring reading material for the waiting time.  IF YOU THINK YOU MAY BE PREGNANT, OR ARE NURSING  PLEASE INFORM THE TECHNOLOGIST.  In preparation for your appointment, medication and supplies will be purchased.  Appointment availability is limited, so if you need to cancel or reschedule, please call the Radiology Department at [phone] Ger[facility] Law) OR [phone] [facility]) 24 hours in advance to avoid a cancellation fee of $100.00  What to Expect When you Arrive:  Once you arrive and check in for your appointment, you will be taken to a preparation room within the Radiology Department.  A technologist or Nurse will obtain your medical history, verify that you are correctly prepped for the exam, and explain the procedure.  Afterwards, an IV will be started in your arm and electrodes will be placed on your skin for EKG monitoring during the stress portion of the exam. Then you will be escorted to the PET/CT scanner.  There, staff will get you positioned on the scanner and obtain a blood pressure and EKG.  During the exam, you will continue to be connected to the EKG and blood pressure machines.  A small, safe amount of a radioactive tracer will be injected in your IV to obtain a series of pictures of your heart along with an injection of a stress agent.    After your Exam:  It is recommended that you eat a meal and drink a caffeinated beverage to counter act any effects of the stress agent.  Drink plenty of fluids for the remainder of the day and urinate frequently for the first couple of hours after the exam.  Your doctor will inform you of your test results within 7-10 business days.  For more information and frequently asked questions, please visit our website: [email or web address]  For questions about your test or how to prepare for your test, please call: Cardiac Imaging Nurse Navigators Office: [phone]    Follow-Up: At [facility], you and your health needs are our priority.  As part of our continuing mission to provide you with exceptional heart care, we  have created designated Provider Care Teams.  These Care Teams include your primary Cardiologist (physician) and Advanced Practice Providers (APPs -  Physician Assistants and Nurse Practitioners) who all work together to provide you with the care you need, when you need it.  Your next appointment:   6 month(s)  Provider:   Vinie JAYSON Maxcy, MD  or Hao Meng, [place]

## 2023-01-21 NOTE — Progress Notes (Signed)
 Cardiology Office Note:  .   Date:  01/21/2023  ID:  Hart KATHEE Blackwood, DOB [birth year], MRN [id number] PCP: Montey Lot, PA-C  [facility] Providers Cardiologist:  Vinie JAYSON Maxcy, MD     History of Present Illness: .   ERDINE HULEN is a 73 y.o. female with past medical history of PAF, hypertension and OSA.  Myoview  obtained on 05/25/2017 showed EF 55%, small area of apical ischemia likely nonsignificant due to marked extracardiac activity, overall low risk study.  Heart monitor obtained in July 2023 showed a single episode of A-fib lasting for hours.  She was started on anticoagulation therapy at that time.  She was seen by Dr. Maxcy in February 2024 at which time she reported fatigue despite the decreased dose of carvedilol .  Coreg  was decreased further.  She was seen by Barnie Fickle NP, blood pressure was uncontrolled.  She was having mild headache and lightheadedness when the blood pressure was high.  Her carvedilol  was switched to metoprolol  succinate and losartan was switched to olmesartan  20 mg daily.   I last saw the patient in July 2024 at which time her blood pressure was much better controlled.  She was doing well.  She presented today for 5 to 66-month follow-up.  She continued to have shortness of breath with activity, this has not changed in the past year.  She has been having some substernal chest tightness with exercise as well that is been going on for the past 3 to 4 months, we will order a PET stress test.  Looking at her medication list, she is on both losartan and olmesartan .  I called her pharmacy which reported she picked up both medication in November.  I have instructed her pharmacy to stop the losartan and prescribe olmesartan  only.  Her blood pressure is normal today, given discontinuation of the losartan, I will increase metoprolol  succinate.  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  Overall, she looks euvolemic on exam.  If PET stress test looks normal, she can follow-up in 6 months.  Earlier if PET stress test came back abnormal.  ROS:   Patient complains of occasional exertional chest pain.  She continued to have shortness of breath with activity which has not changed in the past year.  She has no lower extremity edema, orthopnea or PND.  Studies Reviewed: .        Cardiac Studies & Procedures     STRESS TESTS  MYOCARDIAL PERFUSION IMAGING 05/25/2017  Narrative  The left ventricular ejection fraction is normal (55-65%).  Nuclear stress EF: 55%.  Findings consistent with ischemia.  This is a low risk study.  Small area of apical ischemia not likely significant due to marked extracardiac activity TID 1.3 EF normal 55% area of inferoapical akinesis again likely represents gating of Extracardiac activity    MONITORS  CARDIAC EVENT MONITOR 07/23/2021  Narrative Monitor detected a single episode of afib lasting about 4 hours on 6/11 at 2 pm.  Vinie KYM Maxcy, MD, [facility], FACP [place]  [facility] Medical Director of the [facility]id [facility] Cardiovascular Risk [facility] Diplomate of the American Board of Clinical Lipidology Attending Cardiologist Direct Dial: [phone]  Fax: [phone] Website:  [email or web address]           Risk Assessment/Calculations:    CHA2DS2-VASc Score = 3   This indicates a 3.2% annual  risk of stroke. The patient's score is based upon: CHF History: 0 HTN History: 1 Diabetes History: 0 Stroke History: 0 Vascular Disease History: 0 Age Score: 1 Gender Score: 1            Physical Exam:   VS:  BP 120/82 (BP Location: Left Arm, Patient Position: Sitting, Cuff Size: Normal)   Pulse 97   Ht 5' 4.5 (1.638 m)   Wt 221 lb (100.2 kg)   SpO2 97%   BMI 37.35 kg/m    Wt Readings from Last 3 Encounters:  01/21/23 221 lb (100.2 kg)  07/21/22 219 lb 3.2 oz (99.4 kg)  06/17/22  214 lb (97.1 kg)    GEN: Well nourished, well developed in no acute distress NECK: No JVD; No carotid bruits CARDIAC: RRR, no murmurs, rubs, gallops RESPIRATORY:  Clear to auscultation without rales, wheezing or rhonchi  ABDOMEN: Soft, non-tender, non-distended EXTREMITIES:  No edema; No deformity   ASSESSMENT AND PLAN: .    Chest Pain Patient reports chest tightness with exercise for the past 3-4 months. EKG shows frequent PVCs. -Order PET stress test to evaluate for ischemic heart disease. -If PET stress test is normal, follow up in 6 months. If abnormal, follow up earlier.  Paroxysmal Atrial Fibrillation Patient reports occasional AFib alerts from her cardiac monitor. Currently on anticoagulation therapy with Eliquis . -Continue Eliquis  as prescribed. -Continue monitoring for AFib episodes.  Hypertension Well controlled on current regimen. Noted patient was taking both Losartan and Amisartan, which are in the same drug class. Advised to discontinue Losartan and continue Amisartan. -Discontinue Losartan. -Continue Olmesartan  20mg  daily. -Increase Metoprolol  Succinate to 50mg  daily to compensate for discontinuation of Losartan and to suppress PVCs.  Hyperlipidemia Recent labs show borderline high triglycerides (193, normal <150). -Advise patient to take over-the-counter fish oil twice daily to lower triglyceride levels. -Continue Lipitor for cholesterol management.     Informed Consent   Shared Decision Making/Informed Consent The risks [chest pain, shortness of breath, cardiac arrhythmias, dizziness, blood pressure fluctuations, myocardial infarction, stroke/transient ischemic attack, nausea, vomiting, allergic reaction, radiation exposure, metallic taste sensation and life-threatening complications (estimated to be 1 in 10,000)], benefits (risk stratification, diagnosing coronary artery disease, treatment guidance) and alternatives of a cardiac PET stress test were discussed in  detail with Ms. Fenn and she agrees to proceed.     Dispo: follow up in 6 month  Signed, Roselinda Bahena, [place]

## 2023-01-23 ENCOUNTER — Other Ambulatory Visit: Payer: Self-pay | Admitting: Internal Medicine

## 2023-01-23 DIAGNOSIS — I480 Paroxysmal atrial fibrillation: Secondary | ICD-10-CM

## 2023-01-25 NOTE — Telephone Encounter (Signed)
 Prescription refill request for Eliquis received. Indication:afib Last office visit:1/25 Scr:0.97  10/24 Age: 73 Weight:100.2  kg  Prescription refilled

## 2023-02-17 DIAGNOSIS — G4733 Obstructive sleep apnea (adult) (pediatric): Secondary | ICD-10-CM | POA: Diagnosis not present

## 2023-02-25 ENCOUNTER — Encounter (HOSPITAL_COMMUNITY): Payer: Self-pay

## 2023-03-02 ENCOUNTER — Encounter (HOSPITAL_COMMUNITY)
Admission: RE | Admit: 2023-03-02 | Discharge: 2023-03-02 | Disposition: A | Discharge status: Home / Self Care | Payer: HMO | Source: Ambulatory Visit | Attending: Physician Assistant | Admitting: Physician Assistant

## 2023-03-02 DIAGNOSIS — R0789 Other chest pain: Secondary | ICD-10-CM | POA: Insufficient documentation

## 2023-03-02 LAB — NM PET CT CARDIAC PERFUSION MULTI W/ABSOLUTE BLOODFLOW
MBFR: 3.41
Nuc Rest EF: 49 %
Nuc Stress EF: 62 %
Rest MBF: 0.71 ml/g/min
ST Depression (mm): 0 mm
Stress MBF: 2.42 ml/g/min
TID: 1.1

## 2023-03-02 MED ORDER — RUBIDIUM RB82 GENERATOR (RUBYFILL)
26.4000 | PACK | Freq: Once | INTRAVENOUS | Status: AC
  Administered 2023-03-02: 26.4 via INTRAVENOUS

## 2023-03-02 MED ORDER — REGADENOSON 0.4 MG/5ML IV SOLN
INTRAVENOUS | Status: AC
  Filled 2023-03-02: qty 5

## 2023-03-02 MED ORDER — DEXTROSE 5 % IV SOLN
INTRAVENOUS | Status: AC
  Filled 2023-03-02: qty 50

## 2023-03-02 MED ORDER — REGADENOSON 0.4 MG/5ML IV SOLN
0.4000 mg | Freq: Once | INTRAVENOUS | Status: AC
  Administered 2023-03-02: 0.4 mg via INTRAVENOUS

## 2023-03-02 MED ORDER — RUBIDIUM RB82 GENERATOR (RUBYFILL)
25.9000 | PACK | Freq: Once | INTRAVENOUS | Status: AC
  Administered 2023-03-02: 25.9 via INTRAVENOUS

## 2023-03-02 MED ORDER — CAFFEINE CITRATE BASE COMPONENT 10 MG/ML IV SOLN
INTRAVENOUS | Status: AC
  Filled 2023-03-02: qty 3

## 2023-03-16 ENCOUNTER — Other Ambulatory Visit: Payer: Self-pay | Admitting: Internal Medicine

## 2023-04-06 ENCOUNTER — Other Ambulatory Visit: Payer: Self-pay | Admitting: Student

## 2023-04-06 ENCOUNTER — Other Ambulatory Visit: Payer: Self-pay | Admitting: Internal Medicine

## 2023-04-08 MED ORDER — METOPROLOL SUCCINATE ER 50 MG PO TB24: 50.0000 mg | ORAL_TABLET | Freq: Every day | ORAL | 3 refills | Status: AC

## 2023-05-04 DIAGNOSIS — Z1331 Encounter for screening for depression: Secondary | ICD-10-CM | POA: Diagnosis not present

## 2023-05-04 DIAGNOSIS — F419 Anxiety disorder, unspecified: Secondary | ICD-10-CM | POA: Diagnosis not present

## 2023-05-04 DIAGNOSIS — E039 Hypothyroidism, unspecified: Secondary | ICD-10-CM | POA: Diagnosis not present

## 2023-05-04 DIAGNOSIS — R7303 Prediabetes: Secondary | ICD-10-CM | POA: Diagnosis not present

## 2023-05-04 DIAGNOSIS — G4700 Insomnia, unspecified: Secondary | ICD-10-CM | POA: Diagnosis not present

## 2023-05-04 DIAGNOSIS — K219 Gastro-esophageal reflux disease without esophagitis: Secondary | ICD-10-CM | POA: Diagnosis not present

## 2023-05-04 DIAGNOSIS — I4891 Unspecified atrial fibrillation: Secondary | ICD-10-CM | POA: Diagnosis not present

## 2023-05-04 DIAGNOSIS — Z79899 Other long term (current) drug therapy: Secondary | ICD-10-CM | POA: Diagnosis not present

## 2023-05-04 DIAGNOSIS — I10 Essential (primary) hypertension: Secondary | ICD-10-CM | POA: Diagnosis not present

## 2023-05-04 DIAGNOSIS — Z9181 History of falling: Secondary | ICD-10-CM | POA: Diagnosis not present

## 2023-05-04 DIAGNOSIS — G4733 Obstructive sleep apnea (adult) (pediatric): Secondary | ICD-10-CM | POA: Diagnosis not present

## 2023-05-04 DIAGNOSIS — E7800 Pure hypercholesterolemia, unspecified: Secondary | ICD-10-CM | POA: Diagnosis not present

## 2023-05-18 DIAGNOSIS — G4733 Obstructive sleep apnea (adult) (pediatric): Secondary | ICD-10-CM | POA: Diagnosis not present

## 2023-06-25 ENCOUNTER — Other Ambulatory Visit: Payer: Self-pay | Admitting: Student

## 2023-07-09 ENCOUNTER — Other Ambulatory Visit: Payer: Self-pay | Admitting: Internal Medicine

## 2023-07-09 DIAGNOSIS — I480 Paroxysmal atrial fibrillation: Secondary | ICD-10-CM

## 2023-07-09 NOTE — Telephone Encounter (Signed)
 Prescription refill request for Eliquis  received. Indication:afib Last office visit:1/25 Scr:1.11  4/25 Age: 73 Weight:100.2  kg  Prescription refilled

## 2023-08-17 DIAGNOSIS — G4733 Obstructive sleep apnea (adult) (pediatric): Secondary | ICD-10-CM | POA: Diagnosis not present

## 2023-09-14 DIAGNOSIS — N1831 Chronic kidney disease, stage 3a: Secondary | ICD-10-CM | POA: Diagnosis not present

## 2023-09-28 DIAGNOSIS — N771 Vaginitis, vulvitis and vulvovaginitis in diseases classified elsewhere: Secondary | ICD-10-CM | POA: Diagnosis not present

## 2023-09-28 DIAGNOSIS — N760 Acute vaginitis: Secondary | ICD-10-CM | POA: Diagnosis not present

## 2023-09-28 DIAGNOSIS — N9489 Other specified conditions associated with female genital organs and menstrual cycle: Secondary | ICD-10-CM | POA: Diagnosis not present

## 2023-11-04 DIAGNOSIS — G4733 Obstructive sleep apnea (adult) (pediatric): Secondary | ICD-10-CM | POA: Diagnosis not present

## 2023-11-04 DIAGNOSIS — R7303 Prediabetes: Secondary | ICD-10-CM | POA: Diagnosis not present

## 2023-11-04 DIAGNOSIS — I4891 Unspecified atrial fibrillation: Secondary | ICD-10-CM | POA: Diagnosis not present

## 2023-11-04 DIAGNOSIS — G4700 Insomnia, unspecified: Secondary | ICD-10-CM | POA: Diagnosis not present

## 2023-11-04 DIAGNOSIS — E039 Hypothyroidism, unspecified: Secondary | ICD-10-CM | POA: Diagnosis not present

## 2023-11-04 DIAGNOSIS — E7800 Pure hypercholesterolemia, unspecified: Secondary | ICD-10-CM | POA: Diagnosis not present

## 2023-11-04 DIAGNOSIS — I10 Essential (primary) hypertension: Secondary | ICD-10-CM | POA: Diagnosis not present

## 2023-11-04 DIAGNOSIS — F419 Anxiety disorder, unspecified: Secondary | ICD-10-CM | POA: Diagnosis not present

## 2023-11-04 DIAGNOSIS — Z79899 Other long term (current) drug therapy: Secondary | ICD-10-CM | POA: Diagnosis not present

## 2023-11-04 DIAGNOSIS — K219 Gastro-esophageal reflux disease without esophagitis: Secondary | ICD-10-CM | POA: Diagnosis not present

## 2023-11-22 DIAGNOSIS — Z1272 Encounter for screening for malignant neoplasm of vagina: Secondary | ICD-10-CM | POA: Diagnosis not present

## 2023-11-22 DIAGNOSIS — Z6839 Body mass index (BMI) 39.0-39.9, adult: Secondary | ICD-10-CM | POA: Diagnosis not present

## 2023-11-22 DIAGNOSIS — Z124 Encounter for screening for malignant neoplasm of cervix: Secondary | ICD-10-CM | POA: Diagnosis not present

## 2023-11-22 DIAGNOSIS — N952 Postmenopausal atrophic vaginitis: Secondary | ICD-10-CM | POA: Diagnosis not present

## 2023-11-22 DIAGNOSIS — Z780 Asymptomatic menopausal state: Secondary | ICD-10-CM | POA: Diagnosis not present

## 2023-12-24 ENCOUNTER — Other Ambulatory Visit: Payer: Self-pay | Admitting: Internal Medicine

## 2023-12-24 DIAGNOSIS — I480 Paroxysmal atrial fibrillation: Secondary | ICD-10-CM

## 2023-12-24 NOTE — Telephone Encounter (Signed)
 Eliquis  5mg  refill request received. Patient is 73 years old, weight-100.2kg, Crea-1.15 on 11/05/23 via [employer] from [facility], [place], and last seen by Scot Ford on 01/21/23. Dose is appropriate based on dosing criteria. Will send in refill to requested pharmacy.

## 2024-02-20 ENCOUNTER — Other Ambulatory Visit: Payer: Self-pay | Admitting: Internal Medicine

## 2024-02-23 NOTE — Telephone Encounter (Signed)
 Labs Completed on 11/04/23
